# Patient Record
Sex: Male | Born: 1955 | Race: White | Hispanic: No | Marital: Married | Smoking: Never smoker
Health system: Southern US, Community
[De-identification: ages and names within clinical notes are randomized; demographics above are authoritative.]

## PROBLEM LIST (undated history)

## (undated) DIAGNOSIS — K859 Acute pancreatitis without necrosis or infection, unspecified: Secondary | ICD-10-CM

## (undated) DIAGNOSIS — K219 Gastro-esophageal reflux disease without esophagitis: Secondary | ICD-10-CM

## (undated) DIAGNOSIS — M1990 Unspecified osteoarthritis, unspecified site: Secondary | ICD-10-CM

## (undated) DIAGNOSIS — J45909 Unspecified asthma, uncomplicated: Secondary | ICD-10-CM

## (undated) DIAGNOSIS — E785 Hyperlipidemia, unspecified: Secondary | ICD-10-CM

## (undated) DIAGNOSIS — F419 Anxiety disorder, unspecified: Secondary | ICD-10-CM

## (undated) DIAGNOSIS — T7840XA Allergy, unspecified, initial encounter: Secondary | ICD-10-CM

## (undated) HISTORY — PX: TONSILLECTOMY: SUR1361

## (undated) HISTORY — PX: JOINT REPLACEMENT: SHX530

## (undated) HISTORY — DX: Acute pancreatitis without necrosis or infection, unspecified: K85.90

## (undated) HISTORY — DX: Hyperlipidemia, unspecified: E78.5

## (undated) HISTORY — DX: Allergy, unspecified, initial encounter: T78.40XA

## (undated) HISTORY — DX: Gastro-esophageal reflux disease without esophagitis: K21.9

## (undated) HISTORY — DX: Unspecified asthma, uncomplicated: J45.909

## (undated) HISTORY — DX: Anxiety disorder, unspecified: F41.9

---

## 2007-09-09 ENCOUNTER — Ambulatory Visit (HOSPITAL_COMMUNITY): Admission: RE | Admit: 2007-09-09 | Discharge: 2007-09-09 | Payer: Self-pay | Admitting: Urology

## 2014-04-18 DIAGNOSIS — K219 Gastro-esophageal reflux disease without esophagitis: Secondary | ICD-10-CM | POA: Insufficient documentation

## 2014-04-18 DIAGNOSIS — E782 Mixed hyperlipidemia: Secondary | ICD-10-CM | POA: Insufficient documentation

## 2014-11-21 DIAGNOSIS — L409 Psoriasis, unspecified: Secondary | ICD-10-CM | POA: Insufficient documentation

## 2014-11-21 DIAGNOSIS — R0683 Snoring: Secondary | ICD-10-CM | POA: Insufficient documentation

## 2014-11-21 DIAGNOSIS — N529 Male erectile dysfunction, unspecified: Secondary | ICD-10-CM | POA: Insufficient documentation

## 2014-11-21 DIAGNOSIS — J3089 Other allergic rhinitis: Secondary | ICD-10-CM | POA: Insufficient documentation

## 2014-12-14 DIAGNOSIS — Z79899 Other long term (current) drug therapy: Secondary | ICD-10-CM | POA: Insufficient documentation

## 2015-06-27 DIAGNOSIS — Z23 Encounter for immunization: Secondary | ICD-10-CM | POA: Insufficient documentation

## 2015-06-27 DIAGNOSIS — Z1211 Encounter for screening for malignant neoplasm of colon: Secondary | ICD-10-CM | POA: Insufficient documentation

## 2015-08-03 DIAGNOSIS — R198 Other specified symptoms and signs involving the digestive system and abdomen: Secondary | ICD-10-CM | POA: Insufficient documentation

## 2016-03-04 DIAGNOSIS — M62838 Other muscle spasm: Secondary | ICD-10-CM | POA: Insufficient documentation

## 2016-03-04 DIAGNOSIS — H5711 Ocular pain, right eye: Secondary | ICD-10-CM | POA: Insufficient documentation

## 2016-05-22 ENCOUNTER — Other Ambulatory Visit: Payer: Self-pay | Admitting: Family Medicine

## 2016-05-22 DIAGNOSIS — J320 Chronic maxillary sinusitis: Secondary | ICD-10-CM

## 2016-05-28 ENCOUNTER — Ambulatory Visit
Admission: RE | Admit: 2016-05-28 | Discharge: 2016-05-28 | Disposition: A | Discharge status: Home / Self Care | Source: Ambulatory Visit | Attending: Family Medicine | Admitting: Family Medicine

## 2016-05-28 DIAGNOSIS — J320 Chronic maxillary sinusitis: Secondary | ICD-10-CM

## 2016-06-27 DIAGNOSIS — R519 Headache, unspecified: Secondary | ICD-10-CM | POA: Insufficient documentation

## 2016-07-25 ENCOUNTER — Other Ambulatory Visit: Payer: Self-pay | Admitting: Family Medicine

## 2016-07-25 DIAGNOSIS — R519 Headache, unspecified: Secondary | ICD-10-CM

## 2016-07-25 DIAGNOSIS — R51 Headache: Secondary | ICD-10-CM

## 2016-07-25 DIAGNOSIS — H5711 Ocular pain, right eye: Secondary | ICD-10-CM

## 2016-07-26 ENCOUNTER — Ambulatory Visit
Admission: RE | Admit: 2016-07-26 | Discharge: 2016-07-26 | Disposition: A | Discharge status: Home / Self Care | Source: Ambulatory Visit | Attending: Family Medicine | Admitting: Family Medicine

## 2016-07-26 DIAGNOSIS — H5711 Ocular pain, right eye: Secondary | ICD-10-CM

## 2016-07-26 DIAGNOSIS — R519 Headache, unspecified: Secondary | ICD-10-CM

## 2016-07-26 DIAGNOSIS — R51 Headache: Secondary | ICD-10-CM

## 2017-04-18 DIAGNOSIS — B199 Unspecified viral hepatitis without hepatic coma: Secondary | ICD-10-CM | POA: Insufficient documentation

## 2017-04-18 DIAGNOSIS — F410 Panic disorder [episodic paroxysmal anxiety] without agoraphobia: Secondary | ICD-10-CM | POA: Insufficient documentation

## 2018-03-28 IMAGING — CT CT HEAD W/O CM
3 series · 16 of 47 positions shown, 19 images · non-contrast
Comparison: Maxillofacial CT 05/28/2016.

CLINICAL DATA: 60-year-old male with pain posterior the right eye
and headaches for the past 6 months.

EXAM:
CT HEAD WITHOUT CONTRAST
TECHNIQUE: Contiguous axial images were obtained from the base of the skull
through the vertex without intravenous contrast.

[Series 2: head w/(date) · axial · 0.46mm/px · z∈[+952,+1082]mm · 10 of 32 slices shown, 13 images]
[im 3/32  brain]
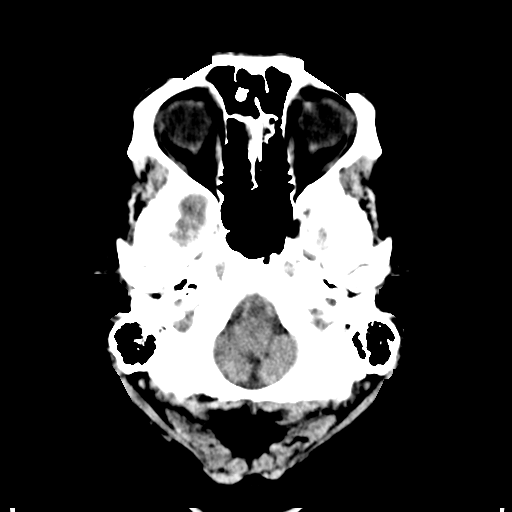
[im 3/32  bone]
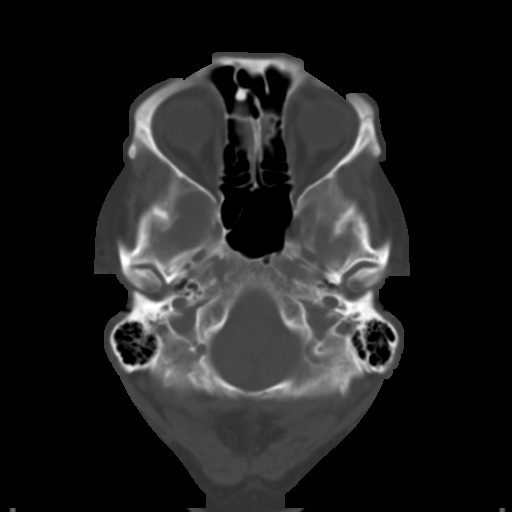
[im 6/32  brain]
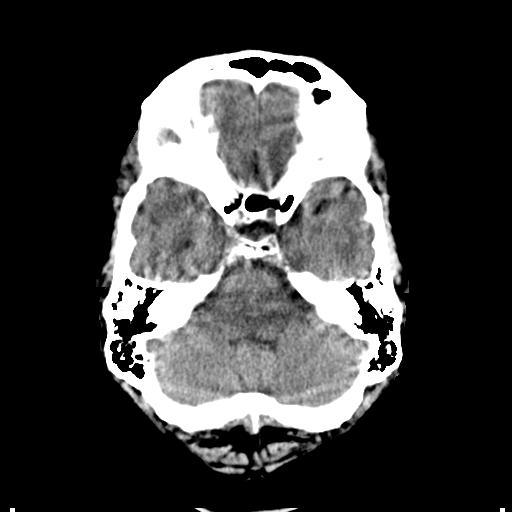
[im 9/32  brain]
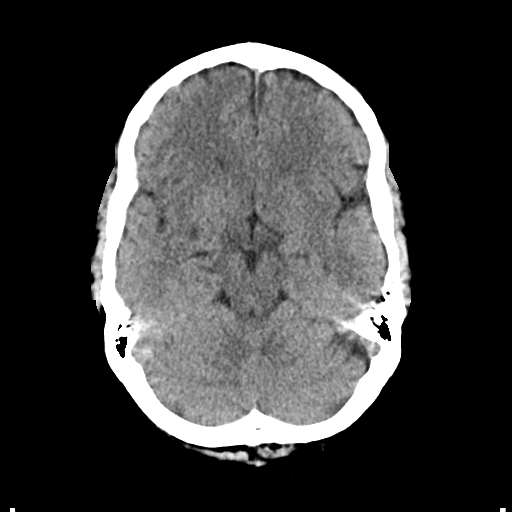
[im 11/32  brain]
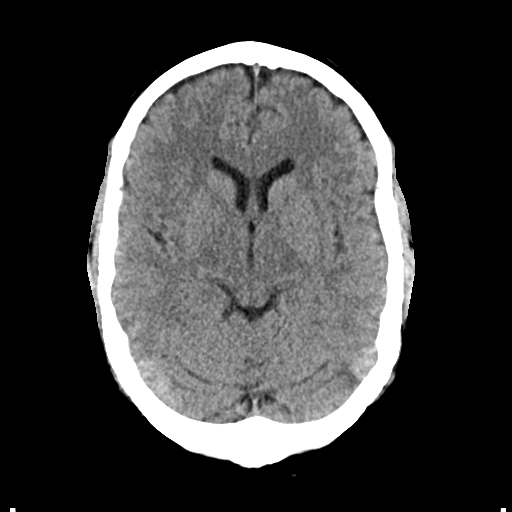
[im 14/32  brain]
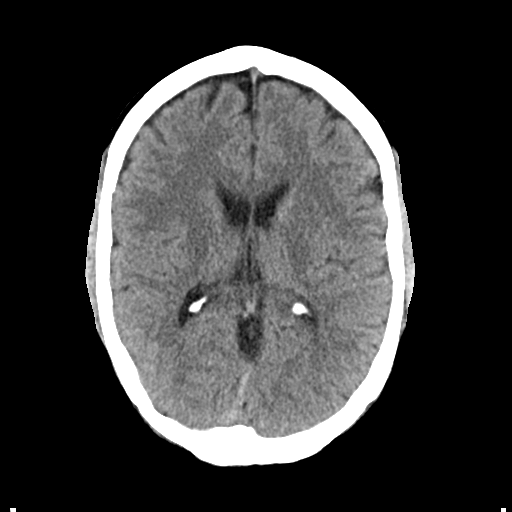
[im 14/32  bone]
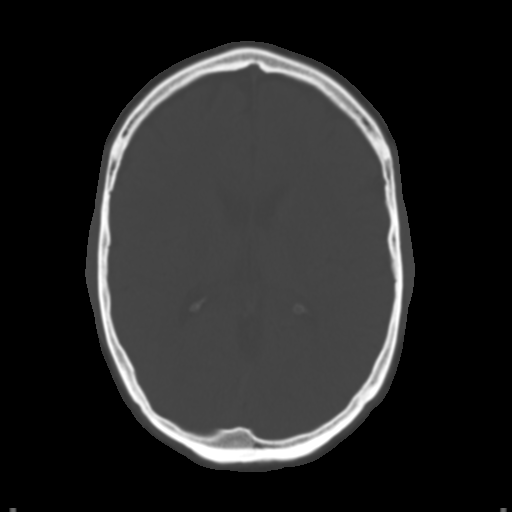
[im 18/32  brain]
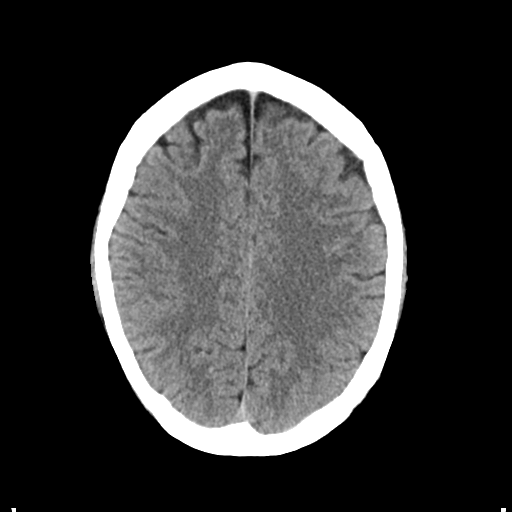
[im 21/32  brain]
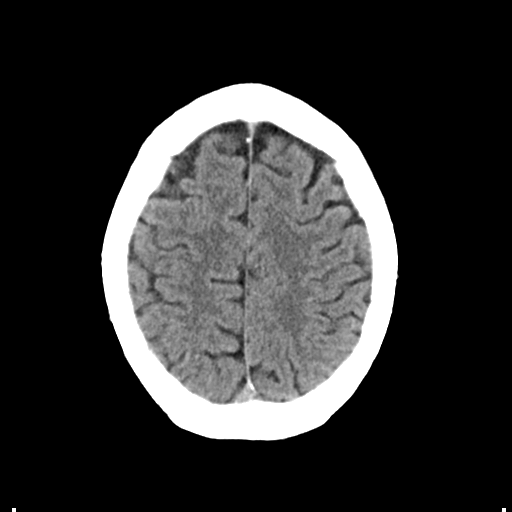
[im 24/32  brain]
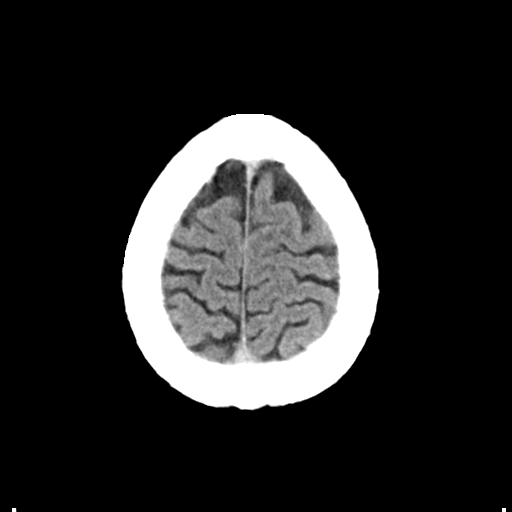
[im 26/32  brain]
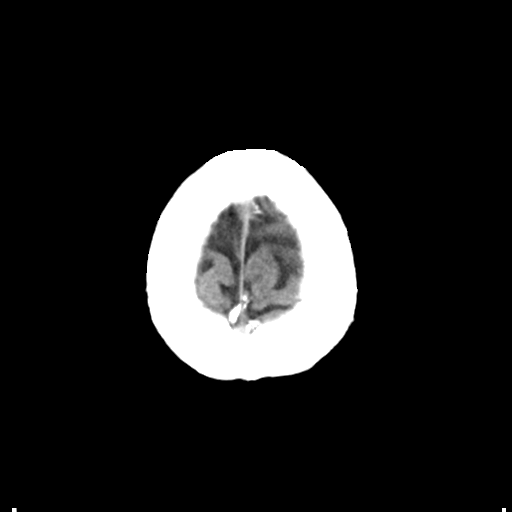
[im 26/32  bone]
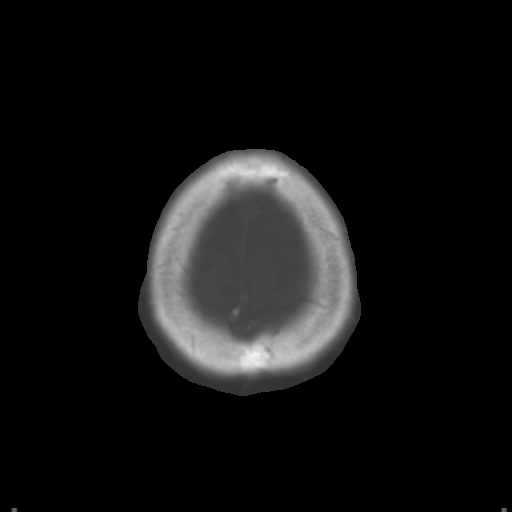
[im 29/32  brain]
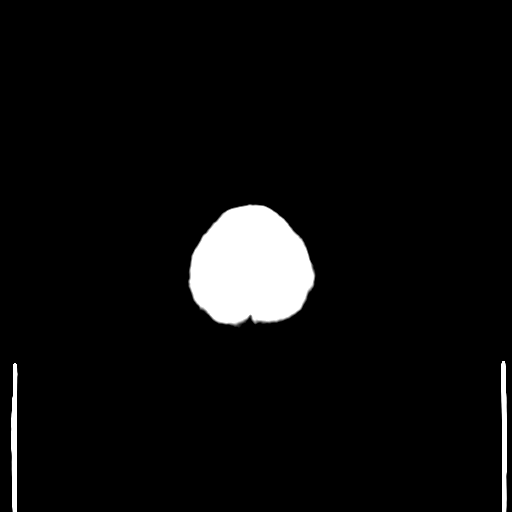

[Series 4: cor · coronal · 0.30mm/px · 3 of 68 slices shown]
[im 23/68  brain]
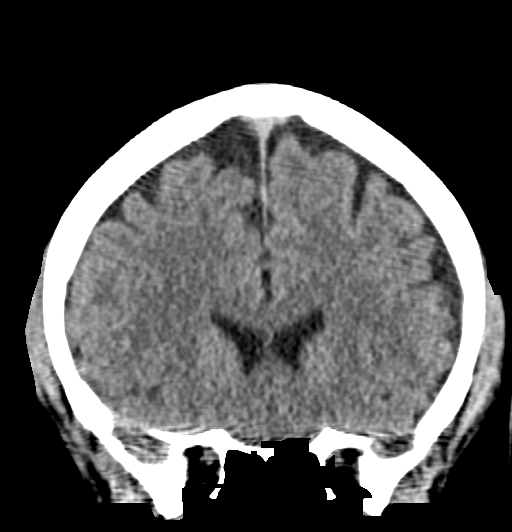
[im 30/68  brain]
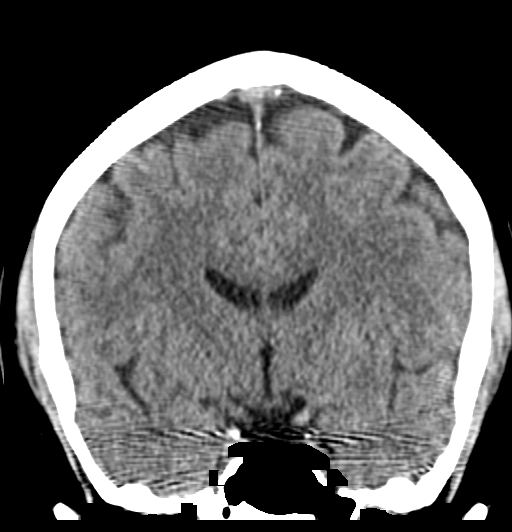
[im 38/68  brain]
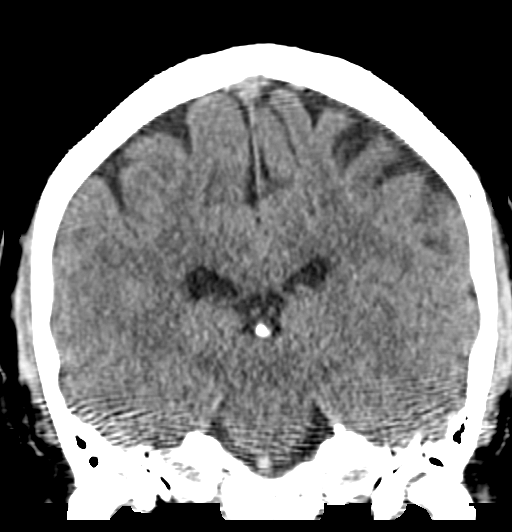

[Series 5: sag · sagittal · 0.31mm/px · 3 of 53 slices shown]
[im 18/53  brain]
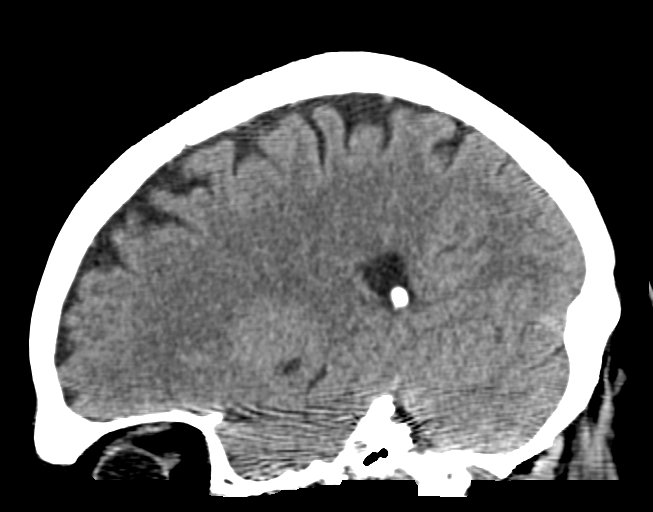
[im 27/53  brain]
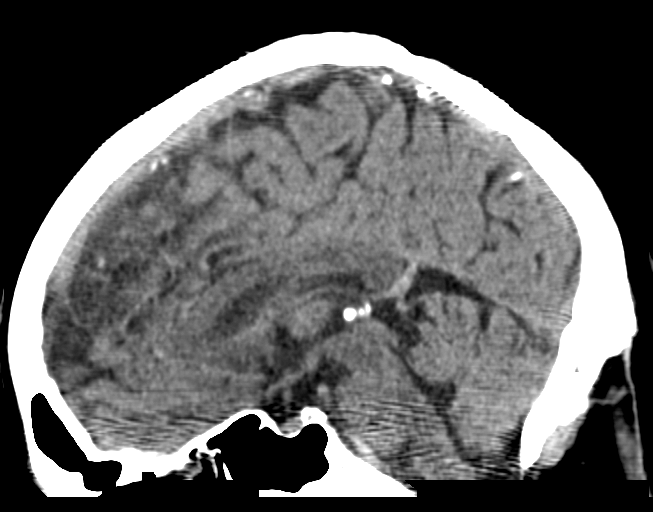
[im 35/53  brain]
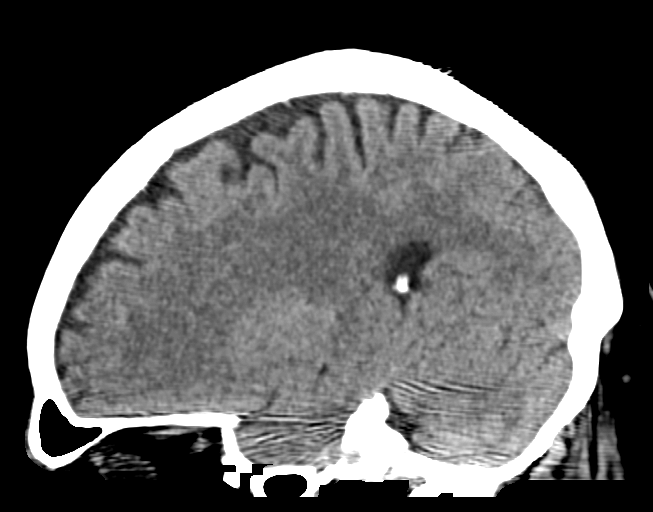

[16 of 47 positions shown; findings below may reference images not displayed]

FINDINGS: Brain: No evidence of acute infarction, hemorrhage, hydrocephalus,
extra-axial collection or mass lesion/mass effect.

Vascular: No hyperdense vessel or unexpected calcification.

Skull: Normal. Negative for fracture or focal lesion.

Sinuses/Orbits: No acute finding.

Other: None.
IMPRESSION: 1. No acute intracranial abnormalities.
2. The appearance of the brain is normal.

## 2018-03-31 DIAGNOSIS — E669 Obesity, unspecified: Secondary | ICD-10-CM | POA: Insufficient documentation

## 2018-03-31 DIAGNOSIS — E66811 Obesity, class 1: Secondary | ICD-10-CM | POA: Insufficient documentation

## 2018-04-01 ENCOUNTER — Encounter: Payer: Self-pay | Admitting: Psychology

## 2018-09-15 ENCOUNTER — Encounter: Admitting: Psychology

## 2018-09-22 ENCOUNTER — Encounter

## 2018-09-24 ENCOUNTER — Encounter: Admitting: Psychology

## 2019-12-02 ENCOUNTER — Encounter: Payer: Self-pay | Admitting: Gastroenterology

## 2020-01-10 ENCOUNTER — Other Ambulatory Visit: Payer: Self-pay

## 2020-01-10 DIAGNOSIS — K859 Acute pancreatitis without necrosis or infection, unspecified: Secondary | ICD-10-CM | POA: Insufficient documentation

## 2020-01-20 ENCOUNTER — Encounter: Payer: Self-pay | Admitting: Gastroenterology

## 2020-01-20 ENCOUNTER — Ambulatory Visit (INDEPENDENT_AMBULATORY_CARE_PROVIDER_SITE_OTHER): Admitting: Gastroenterology

## 2020-01-20 VITALS — BP 130/90 | HR 64 | Ht 71.0 in | Wt 256.0 lb

## 2020-01-20 DIAGNOSIS — K449 Diaphragmatic hernia without obstruction or gangrene: Secondary | ICD-10-CM

## 2020-01-20 DIAGNOSIS — K219 Gastro-esophageal reflux disease without esophagitis: Secondary | ICD-10-CM | POA: Diagnosis not present

## 2020-01-20 NOTE — Patient Instructions (Signed)
If you are age 65 or older, your body mass index should be between 23-30. Your Body mass index is 35.7 kg/m. If this is out of the aforementioned range listed, please consider follow up with your Primary Care Provider.  If you are age 44 or younger, your body mass index should be between 19-25. Your Body mass index is 35.7 kg/m. If this is out of the aformentioned range listed, please consider follow up with your Primary Care Provider.   You will be due for a recall colonoscopy/EGD in March 2027. We will send you a reminder in the mail when it gets closer to that time.  It was a pleasure to see you today!  Dr. Myrtie Neither

## 2020-01-20 NOTE — Progress Notes (Signed)
[facility] Consult Note:  History: William Lynch 01/20/2020  Referring provider: Angelica Chessman, MD  Reason for consult/chief complaint: Gastroesophageal Reflux (Chronic, medically controlled, family hx of esophageal issues, discuss EGD)   Subjective  HPI:  This is a very pleasant 64 year old man accompanied by his wife, requesting opinion regarding management of his GERD and any necessary endoscopic follow-up.  He has had perhaps 15 years of heartburn and regurgitation, generally under very good control with once daily pantoprazole (other PPIs in the past).  On occasion years ago he might miss a dose and noticed heartburn returning, so he has really stayed on it every day since then.  He denies dysphagia, odynophagia, nausea vomiting early satiety or weight loss.  Rare nocturnal symptoms, perhaps just a couple times a year, if he eats late.  Bowel habits are regular without rectal bleeding.  Previous endoscopic testing as noted below.   ROS:  Review of Systems He denies chest pain dyspnea or dysuria  Past Medical History: Past Medical History:  Diagnosis Date  . Asthma    childhood  . GERD (gastroesophageal reflux disease)   . Hyperlipemia   . Pancreatitis      Past Surgical History:  No surgeries   Family History: Family History  Problem Relation Age of Onset  . Pneumonia Father        with aspiration   . Hypertension Father   . Aneurysm Father   . Cancer Father   . Cancer Mother   . Bipolar disorder Son     Part of the reason for visit today was that William Lynch's father recently passed, and apparently of complications from aspiration.  He understood this may have been a chronic problem for his father but he was unaware of it.  Also multiple extended family members have apparently been diagnosed with various upper digestive conditions.  Social History: Social History   Socioeconomic History  . Marital status: Married    Spouse name: Not  on file  . Number of children: 2  . Years of education: Not on file  . Highest education level: Not on file  Occupational History  . Occupation: IT  Tobacco Use  . Smoking status: Never Smoker  . Smokeless tobacco: Never Used  Substance and Sexual Activity  . Alcohol use: Not Currently  . Drug use: Never  . Sexual activity: Yes  Other Topics Concern  . Not on file  Social History Narrative  . Not on file   Social Determinants of Health   Financial Resource Strain:   . Difficulty of Paying Living Expenses:   Food Insecurity:   . Worried About Programme researcher, broadcasting/film/video in the Last Year:   . Barista in the Last Year:   Transportation Needs:   . Freight forwarder (Medical):   Marland Kitchen Lack of Transportation (Non-Medical):   Physical Activity:   . Days of Exercise per Week:   . Minutes of Exercise per Session:   Stress:   . Feeling of Stress :   Social Connections:   . Frequency of Communication with Friends and Family:   . Frequency of Social Gatherings with Friends and Family:   . Attends Religious Services:   . Active Member of Clubs or Organizations:   . Attends Banker Meetings:   Marland Kitchen Marital Status:     Allergies: Allergies  Allergen Reactions  . Sulfa Antibiotics Other (See Comments)    Swelling of eyes, with eye drainage  .  Sulfamethoxazole Other (See Comments)    Unsure had medicine as a child Unsure had medicine as a child   . Other Nausea And Vomiting    States topical iodine causes no problems. Other reaction(s): Nausea And Vomiting States topical iodine causes no problems.     Outpatient Meds: Current Outpatient Medications  Medication Sig Dispense Refill  . clobetasol cream (TEMOVATE) 0.05 % Apply 1 application topically 2 (two) times daily.     Marland Kitchen ibuprofen (ADVIL) 200 MG tablet Take by mouth.    . levocetirizine (XYZAL) 5 MG tablet Take by mouth.    . pantoprazole (PROTONIX) 40 MG tablet Take by mouth.    . simvastatin (ZOCOR) 10  MG tablet Take 1 tablet by mouth at bedtime.    . tadalafil (CIALIS) 20 MG tablet      No current facility-administered medications for this visit.      ___________________________________________________________________ Objective   Exam:  BP (!) 130/90   Pulse 64   Ht 5\' 11"  (1.803 m)   Wt (!) 256 lb (116.1 kg)   BMI 35.70 kg/m    General: Well-appearing, pleasant and conversational, normal vocal quality  Eyes: sclera anicteric, no redness  ENT: oral mucosa moist without lesions, no cervical or supraclavicular lymphadenopathy  CV: RRR without murmur, S1/S2, no JVD, no peripheral edema  Resp: clear to auscultation bilaterally, normal RR and effort noted  GI: soft, obese, no tenderness, with active bowel sounds. No guarding or palpable organomegaly noted, limited by body habitus  Skin; warm and dry, no rash or jaundice noted  Neuro: awake, alert and oriented x 3. Normal gross motor function and fluent speech   Other:  Report of upper endoscopy and colonoscopy by Dr. March 2017:  Minimal gastritis without erosion, biopsy taken to rule out H. Pylori. Tiny hyperplastic probable fundic gland polyp, one of them was biopsied. Small sliding hiatal hernia.  Minimal distal esophagitis with no stricture or tumor.  Biopsy was taken proximal to the Z-line.  Oropharynx and larynx appeared normal.  Colonoscopy complete to cecum with good preparation.  No polyps seen.  Minimal internal hemorrhoids without inflammation.  Biopsies negative for H. pylori, benign fundic gland polyp, benign distal esophageal mucosa without inflammation, no Barrett's.  Assessment: Encounter Diagnoses  Name Primary?  . Gastroesophageal reflux disease without esophagitis Yes  . Hiatal hernia     Many years of chronic stable GERD symptoms, rare breakthrough so generally good control with medicines, diet and lifestyle.  Some weight loss would help his symptoms, something of which he is  aware.  Hiatal hernia was described as small and sliding, so questionable contribution to the reflux.  Plan:  I reassured him that he does not eat any endoscopic evaluation at this point since he has been stable for so long and continued to do so well since the 2017 endoscopy.  However, there is the risk of Barrett's esophagus, so I recommended an upper endoscopy with his next scheduled routine colonoscopy in 2027.  He plans to see our practice for that, so we put recalls  for both exams in our database.  I recommended he try a decrease of the PPI to once every other day and see if he still has good symptom control.  Thank you for the courtesy of this consult.  Please call me with any questions or concerns.  2028 III  CC: Referring provider noted above

## 2020-04-08 ENCOUNTER — Ambulatory Visit: Attending: Internal Medicine

## 2020-04-08 DIAGNOSIS — Z23 Encounter for immunization: Secondary | ICD-10-CM

## 2020-04-08 NOTE — Progress Notes (Signed)
   Covid-19 Vaccination Clinic  Name:  William Lynch    MRN: [id number] DOB: [birth year]  04/08/2020  [name] was observed post Covid-19 immunization for 15 minutes without incident. He was provided with Vaccine Information Sheet and instruction to access the V-Safe system.   [name] was instructed to call 911 with any severe reactions post vaccine: Marland Kitchen Difficulty breathing  . Swelling of face and throat  . A fast heartbeat  . A bad rash all over body  . Dizziness and weakness

## 2020-06-01 ENCOUNTER — Other Ambulatory Visit: Payer: Self-pay

## 2020-06-01 ENCOUNTER — Ambulatory Visit (INDEPENDENT_AMBULATORY_CARE_PROVIDER_SITE_OTHER): Payer: Medicare Other | Admitting: Otolaryngology

## 2020-06-01 ENCOUNTER — Encounter (INDEPENDENT_AMBULATORY_CARE_PROVIDER_SITE_OTHER): Payer: Self-pay | Admitting: Otolaryngology

## 2020-06-01 VITALS — Temp 96.3°F

## 2020-06-01 DIAGNOSIS — H9313 Tinnitus, bilateral: Secondary | ICD-10-CM

## 2020-06-01 DIAGNOSIS — H6123 Impacted cerumen, bilateral: Secondary | ICD-10-CM

## 2020-06-01 DIAGNOSIS — R0683 Snoring: Secondary | ICD-10-CM

## 2020-06-01 NOTE — Progress Notes (Signed)
HPI: William Lynch is a 64 y.o. male who presents is referred by by his PCP for evaluation of tinnitus in both ears except for a number of years.  He also notices blockage in the left ear when he sleeps on the left side when he wakes up in the morning.  He denies history of loud noise exposure. He also inquires about any options for treatment of snoring.  He has had a long history of snoring problems and previously had palatoplasty by a Lexicographer years ago.  He states that he feels like he sleeps well does not wake up during the night.  Denies being tired during the day or taking naps and does not have clinical symptoms of obstructive sleep apnea.  Denies any trouble breathing through his nose.  Past Medical History:  Diagnosis Date  . Asthma    childhood  . GERD (gastroesophageal reflux disease)   . Hyperlipemia   . Pancreatitis    Past Surgical History:  Procedure Laterality Date  . TONSILLECTOMY     Social History   Socioeconomic History  . Marital status: Married    Spouse name: Not on file  . Number of children: 2  . Years of education: Not on file  . Highest education level: Not on file  Occupational History  . Occupation: IT  Tobacco Use  . Smoking status: Never Smoker  . Smokeless tobacco: Never Used  Substance and Sexual Activity  . Alcohol use: Not Currently  . Drug use: Never  . Sexual activity: Yes  Other Topics Concern  . Not on file  Social History Narrative  . Not on file   Social Determinants of Health   Financial Resource Strain: Not on file  Food Insecurity: Not on file  Transportation Needs: Not on file  Physical Activity: Not on file  Stress: Not on file  Social Connections: Not on file   Family History  Problem Relation Age of Onset  . Pneumonia Father        with aspiration   . Hypertension Father   . Aneurysm Father   . Cancer Father   . Cancer Mother   . Bipolar disorder Son    Allergies  Allergen Reactions  . Sulfa Antibiotics  Other (See Comments)    Swelling of eyes, with eye drainage  . Sulfamethoxazole Other (See Comments)    Unsure had medicine as a child Unsure had medicine as a child   . Other Nausea And Vomiting    States topical iodine causes no problems. Other reaction(s): Nausea And Vomiting States topical iodine causes no problems.    Prior to Admission medications   Medication Sig Start Date End Date Taking? Authorizing Provider  clobetasol cream (TEMOVATE) 0.05 % Apply 1 application topically 2 (two) times daily.    Yes [provider]  ibuprofen (ADVIL) 200 MG tablet Take by mouth.   Yes [provider]  levocetirizine (XYZAL) 5 MG tablet Take by mouth.   Yes [provider]  simvastatin (ZOCOR) 10 MG tablet Take 1 tablet by mouth at bedtime. 06/27/16  Yes [provider]  tadalafil (CIALIS) 20 MG tablet  06/27/16  Yes [provider]  pantoprazole (PROTONIX) 40 MG tablet Take by mouth. 06/27/16 02/03/20  [provider]     Positive ROS: Otherwise negative  All other systems have been reviewed and were otherwise negative with the exception of those mentioned in the HPI and as above.  Physical Exam: Constitutional: Alert, well-appearing,  no acute distress Ears: External ears without lesions or tenderness. Ear canals reveal a large amount of wax in both ear canals worse on the left side.  This was cleaned in the office using suction and curettes.  TMs were clear otherwise. Nasal: External nose without lesions. Septum minimal deviation to the right with mild rhinitis.. Clear nasal passages otherwise. Oral: Lips and gums without lesions. Tongue and palate mucosa without lesions. Posterior oropharynx clear.  Patient is status post tonsillectomy and palatoplasty with no uvula Neck: No palpable adenopathy or masses Respiratory: Breathing comfortably  Skin: No facial/neck lesions or rash noted.  Cerumen impaction removal  Date/Time: 06/01/2020 1:08  PM Performed by: Drema Halon, MD Authorized by: Drema Halon, MD   Consent:    Consent obtained:  Verbal   Consent given by:  Patient   Risks discussed:  Pain and bleeding Procedure details:    Location:  L ear and R ear   Procedure type: curette and suction   Post-procedure details:    Inspection:  TM intact and canal normal   Hearing quality:  Improved   Patient tolerance of procedure:  Tolerated well, no immediate complications Comments:     TMs are clear bilaterally.    Assessment: Tinnitus Cerumen buildup in both ears worse on the left Snoring status post previous palatoplasty  Plan: Would recommend use of Nasacort or Flonase 2 sprays each nostril at night for any nasal congestion that may help with her snoring.  Briefly discussed with him concerning use of Afrin occasionally to see if this helps significantly with his snoring.  I would not recommend using this regularly. Unable to obtain audiogram today and will follow up tomorrow after audiologic testing.   Narda Bonds, MD   CC:

## 2020-06-02 ENCOUNTER — Encounter (INDEPENDENT_AMBULATORY_CARE_PROVIDER_SITE_OTHER): Payer: Self-pay | Admitting: Otolaryngology

## 2020-06-02 ENCOUNTER — Other Ambulatory Visit: Payer: Self-pay

## 2020-06-02 ENCOUNTER — Ambulatory Visit (INDEPENDENT_AMBULATORY_CARE_PROVIDER_SITE_OTHER): Payer: Medicare Other | Admitting: Otolaryngology

## 2020-06-02 VITALS — Temp 97.0°F

## 2020-06-02 DIAGNOSIS — H9313 Tinnitus, bilateral: Secondary | ICD-10-CM

## 2020-06-02 NOTE — Progress Notes (Signed)
HPI: William Lynch is a 64 y.o. male who returns today for evaluation of audiogram.  He was seen yesterday but was unable to get audiogram.  Audiogram was performed in the office today because of chronic tinnitus in his ears.  On review of the audiogram this demonstrated essentially normal hearing up to 4000 frequency in both ears but then in the left ear he had a downsloping sensorineural hearing loss of moderate degree.  The right ear had no significant drop in the hearing loss.  He had type A tympanograms bilaterally.  He apparently has had history of ringing in his ears for years..  Past Medical History:  Diagnosis Date  . Asthma    childhood  . GERD (gastroesophageal reflux disease)   . Hyperlipemia   . Pancreatitis    Past Surgical History:  Procedure Laterality Date  . TONSILLECTOMY     Social History   Socioeconomic History  . Marital status: Married    Spouse name: Not on file  . Number of children: 2  . Years of education: Not on file  . Highest education level: Not on file  Occupational History  . Occupation: IT  Tobacco Use  . Smoking status: Never Smoker  . Smokeless tobacco: Never Used  Substance and Sexual Activity  . Alcohol use: Not Currently  . Drug use: Never  . Sexual activity: Yes  Other Topics Concern  . Not on file  Social History Narrative  . Not on file   Social Determinants of Health   Financial Resource Strain: Not on file  Food Insecurity: Not on file  Transportation Needs: Not on file  Physical Activity: Not on file  Stress: Not on file  Social Connections: Not on file   Family History  Problem Relation Age of Onset  . Pneumonia Father        with aspiration   . Hypertension Father   . Aneurysm Father   . Cancer Father   . Cancer Mother   . Bipolar disorder Son    Allergies  Allergen Reactions  . Sulfa Antibiotics Other (See Comments)    Swelling of eyes, with eye drainage  . Sulfamethoxazole Other (See Comments)    Unsure had  medicine as a child Unsure had medicine as a child   . Other Nausea And Vomiting    States topical iodine causes no problems. Other reaction(s): Nausea And Vomiting States topical iodine causes no problems.    Prior to Admission medications   Medication Sig Start Date End Date Taking? Authorizing Provider  clobetasol cream (TEMOVATE) 0.05 % Apply 1 application topically 2 (two) times daily.     [provider]  ibuprofen (ADVIL) 200 MG tablet Take by mouth.    [provider]  levocetirizine (XYZAL) 5 MG tablet Take by mouth.    [provider]  pantoprazole (PROTONIX) 40 MG tablet Take by mouth. 06/27/16 02/03/20  [provider]  simvastatin (ZOCOR) 10 MG tablet Take 1 tablet by mouth at bedtime. 06/27/16   [provider]  tadalafil (CIALIS) 20 MG tablet  06/27/16   [provider]     Positive ROS: Otherwise negative  All other systems have been reviewed and were otherwise negative with the exception of those mentioned in the HPI and as above.  Physical Exam: Constitutional: Alert, well-appearing, no acute distress Ears: External ears without lesions or tenderness. Ear canals are clear bilaterally with intact, clear TMs.  Bilaterally. Nasal: External nose without lesions.  Moderate  rhinitis bilaterally. Clear nasal passages otherwise. Oral: Lips and gums without lesions. Tongue and palate mucosa without lesions. Posterior oropharynx clear. Neck: No palpable adenopathy or masses Respiratory: Breathing comfortably  Skin: No facial/neck lesions or rash noted.  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  Suggested using masking noise to help control  tinnitus when it is quiet or when the tinnitus is worse. Also reviewed with him concerning using ear protection when around loud noise. Gave him some  samples of Lipo flavonoid to try as this is beneficial in some people with tinnitus. He will follow-up as needed.   Narda Bonds, MD

## 2020-07-04 ENCOUNTER — Encounter (INDEPENDENT_AMBULATORY_CARE_PROVIDER_SITE_OTHER): Payer: Self-pay

## 2021-11-07 DIAGNOSIS — L503 Dermatographic urticaria: Secondary | ICD-10-CM | POA: Insufficient documentation

## 2023-02-14 ENCOUNTER — Ambulatory Visit: Payer: Self-pay | Admitting: Student

## 2023-02-27 NOTE — Progress Notes (Addendum)
COVID Vaccine received:  []  No [x]  Yes Date of any COVID positive Test in last 90 days: No PCP - Bernadette Hoit MD Cardiologist - no  Medical Clearance -Dr. Riley Nearing 12/27/22 [facility]  Chest x-ray -  EKG -  July 5,2024 Stress Test -  ECHO -  Cardiac Cath -   Bowel Prep - [x]  No  []   Yes ______  Pacemaker / ICD device [x]  No []  Yes   Spinal Cord Stimulator:[x]  No []  Yes       History of Sleep Apnea? [x]  No []  Yes   CPAP used?- [x]  No []  Yes    Does the patient monitor blood sugar?          [x]  No []  Yes  []  N/A  Patient has: [x]  NO Hx DM   []  Pre-DM                 []  DM1  []   DM2 Does patient have a [employer] or Dexacom? []  No []  Yes   Fasting Blood Sugar Ranges-  Checks Blood Sugar _____ times a day  GLP1 agonist / usual dose - no GLP1 instructions:  SGLT-2 inhibitors / usual dose - no SGLT-2 instructions:   Blood Thinner / Instructions:no Aspirin Instructions:no  Comments:   Activity level: Patient is able to climb a flight of stairs without difficulty; [x]  No CP  [x]  No SOB, but would have _hip pain__   Patient canperform ADLs without assistance.   Anesthesia review:   Patient denies shortness of breath, fever, cough and chest pain at PAT appointment.  Patient verbalized understanding and agreement to the Pre-Surgical Instructions that were given to them at this PAT appointment. Patient was also educated of the need to review these PAT instructions again prior to his/her surgery.I reviewed the appropriate phone numbers to call if they have any and questions or concerns.

## 2023-02-27 NOTE — Patient Instructions (Addendum)
SURGICAL WAITING ROOM VISITATION  Patients having surgery or a procedure may have no more than 2 support people in the waiting area - these visitors may rotate.    Children under the age of 81 must have an adult with them who is not the patient.  Due to an increase in RSV and influenza rates and associated hospitalizations, children ages 69 and under may not visit patients in [facility] hospitals.  If the patient needs to stay at the hospital during part of their recovery, the visitor guidelines for inpatient rooms apply. Pre-op nurse will coordinate an appropriate time for 1 support person to accompany patient in pre-op.  This support person may not rotate.    Please refer to the [facility] website for the visitor guidelines for Inpatients (after your surgery is over and you are in a regular room).       Your procedure is scheduled on: 03/13/23   Report to [facility] Main Entrance    Report to admitting at 5:15 AM   Call this number if you have problems the morning of surgery [phone]   Do not eat food :After Midnight.   After Midnight you may have the following liquids until 4:30 AM DAY OF SURGERY  Water Non-Citrus Juices (without pulp, NO RED-Apple, White grape, White cranberry) Black Coffee (NO MILK/CREAM OR CREAMERS, sugar ok)  Clear Tea (NO MILK/CREAM OR CREAMERS, sugar ok) regular and decaf                             Plain Jell-O (NO RED)                                           Fruit ices (not with fruit pulp, NO RED)                                     Popsicles (NO RED)                                                               Sports drinks like Gatorade (NO RED)                    The day of surgery:  Drink ONE (1) Pre-Surgery Clear Ensure  At 4:30  AM the morning of surgery. Drink in one sitting. Do not sip.  This drink was given to you during your hospital  pre-op appointment visit. Nothing else to drink after completing the  Pre-Surgery  Clear Ensure    Oral Hygiene is also important to reduce your risk of infection.                                    Remember - BRUSH YOUR TEETH THE MORNING OF SURGERY WITH YOUR REGULAR TOOTHPASTE   Stop all vitamins and herbal supplements 7 days before surgery.   Take these medication day of surgery: Ezetimibe, Xyzal, Protonix  You may not have any metal on your body including hair pins, jewelry, and body piercing             Do not wear make-up, lotions, powders, perfumes/cologne, or deodorant              Men may shave face and neck.   Do not bring valuables to the hospital. [facility] IS NOT             RESPONSIBLE   FOR VALUABLES.   Contacts, glasses, dentures or bridgework may not be worn into surgery.  DO NOT BRING YOUR HOME MEDICATIONS TO THE HOSPITAL. PHARMACY WILL DISPENSE MEDICATIONS LISTED ON YOUR MEDICATION LIST TO YOU DURING YOUR ADMISSION IN THE HOSPITAL!    Patients discharged on the day of surgery will not be allowed to drive home.  Someone NEEDS to stay with you for the first 24 hours after anesthesia.   Special Instructions: Bring a copy of your healthcare power of attorney and living will documents the day of surgery if you haven't scanned them before.              Please read over the following fact sheets you were given: IF YOU HAVE QUESTIONS ABOUT YOUR PRE-OP INSTRUCTIONS PLEASE CALL [phone] Rosey [facility]   If you received a COVID test during your pre-op visit  it is requested that you wear a mask when out in public, stay away from anyone that may not be feeling well and notify your surgeon if you develop symptoms. If you test positive for Covid or have been in contact with anyone that has tested positive in the last 10 days please notify you surgeon.      Pre-operative 5 CHG Bath Instructions   You can play a key role in reducing the risk of infection after surgery. Your skin needs to be as free of germs as possible. You can reduce the number of  germs on your skin by washing with CHG (chlorhexidine gluconate) soap before surgery. CHG is an antiseptic soap that kills germs and continues to kill germs even after washing.   DO NOT use if you have an allergy to chlorhexidine/CHG or antibacterial soaps. If your skin becomes reddened or irritated, stop using the CHG and notify one of our RNs at [phone].   Please shower with the CHG soap starting 4 days before surgery using the following schedule:     Please keep in mind the following:  DO NOT shave, including legs and underarms, starting the day of your first shower.   You may shave your face at any point before/day of surgery.  Place clean sheets on your bed the day you start using CHG soap. Use a clean washcloth (not used since being washed) for each shower. DO NOT sleep with pets once you start using the CHG.   CHG Shower Instructions:  If you choose to wash your hair and private area, wash first with your normal shampoo/soap.  After you use shampoo/soap, rinse your hair and body thoroughly to remove shampoo/soap residue.  Turn the water OFF and apply about 3 tablespoons (45 ml) of CHG soap to a CLEAN washcloth.  Apply CHG soap ONLY FROM YOUR NECK DOWN TO YOUR TOES (washing for 3-5 minutes)  DO NOT use CHG soap on face, private areas, open wounds, or sores.  Pay special attention to the area where your surgery is being performed.  If you are having back surgery, having someone wash your back for you  may be helpful. Wait 2 minutes after CHG soap is applied, then you may rinse off the CHG soap.  Pat dry with a clean towel  Put on clean clothes/pajamas   If you choose to wear lotion, please use ONLY the CHG-compatible lotions on the back of this paper.     Additional instructions for the day of surgery: DO NOT APPLY any lotions, deodorants, cologne, or perfumes.   Put on clean/comfortable clothes.  Brush your teeth.  Ask your nurse before applying any prescription medications  to the skin.      CHG Compatible Lotions   Aveeno Moisturizing lotion  Cetaphil Moisturizing Cream  Cetaphil Moisturizing Lotion  Clairol Herbal Essence Moisturizing Lotion, Dry Skin  Clairol Herbal Essence Moisturizing Lotion, Extra Dry Skin  Clairol Herbal Essence Moisturizing Lotion, Normal Skin  Curel Age Defying Therapeutic Moisturizing Lotion with Alpha Hydroxy  Curel Extreme Care Body Lotion  Curel Soothing Hands Moisturizing Hand Lotion  Curel Therapeutic Moisturizing Cream, Fragrance-Free  Curel Therapeutic Moisturizing Lotion, Fragrance-Free  Curel Therapeutic Moisturizing Lotion, Original Formula  Eucerin Daily Replenishing Lotion  Eucerin Dry Skin Therapy Plus Alpha Hydroxy Crme  Eucerin Dry Skin Therapy Plus Alpha Hydroxy Lotion  Eucerin Original Crme  Eucerin Original Lotion  Eucerin Plus Crme Eucerin Plus Lotion  Eucerin TriLipid Replenishing Lotion  Keri Anti-Bacterial Hand Lotion  Keri Deep Conditioning Original Lotion Dry Skin Formula Softly Scented  Keri Deep Conditioning Original Lotion, Fragrance Free Sensitive Skin Formula  Keri Lotion Fast Absorbing Fragrance Free Sensitive Skin Formula  Keri Lotion Fast Absorbing Softly Scented Dry Skin Formula  Keri Original Lotion  Keri Skin Renewal Lotion Keri Silky Smooth Lotion  Keri Silky Smooth Sensitive Skin Lotion  Nivea Body Creamy Conditioning Oil  Nivea Body Extra Enriched Lotion  Nivea Body Original Lotion  Nivea Body Sheer Moisturizing Lotion Nivea Crme  Nivea Skin Firming Lotion  NutraDerm 30 Skin Lotion  NutraDerm Skin Lotion  NutraDerm Therapeutic Skin Cream  NutraDerm Therapeutic Skin Lotion  ProShield Protective Hand Cream  Provon moisturizing lotion Incentive Spirometer (Watch this video at home: [email or web address])  An incentive spirometer is a tool that can help keep your lungs clear and active. This tool measures how well you are filling your lungs with each  breath. Taking long deep breaths may help reverse or decrease the chance of developing breathing (pulmonary) problems (especially infection) following: A long period of time when you are unable to move or be active. BEFORE THE PROCEDURE  If the spirometer includes an indicator to show your best effort, your nurse or respiratory therapist will set it to a desired goal. If possible, sit up straight or lean slightly forward. Try not to slouch. Hold the incentive spirometer in an upright position. INSTRUCTIONS FOR USE  Sit on the edge of your bed if possible, or sit up as far as you can in bed or on a chair. Hold the incentive spirometer in an upright position. Breathe out normally. Place the mouthpiece in your mouth and seal your lips tightly around it. Breathe in slowly and as deeply as possible, raising the piston or the ball toward the top of the column. Hold your breath for 3-5 seconds or for as long as possible. Allow the piston or ball to fall to the bottom of the column. Remove the mouthpiece from your mouth and breathe out normally. Rest for a few seconds and repeat Steps 1 through 7 at least 10 times every 1-2 hours when you are awake. Take your time  and take a few normal breaths between deep breaths. The spirometer may include an indicator to show your best effort. Use the indicator as a goal to work toward during each repetition. After each set of 10 deep breaths, practice coughing to be sure your lungs are clear. If you have an incision (the cut made at the time of surgery), support your incision when coughing by placing a pillow or rolled up towels firmly against it. Once you are able to get out of bed, walk around indoors and cough well. You may stop using the incentive spirometer when instructed by your caregiver.  RISKS AND COMPLICATIONS Take your time so you do not get dizzy or light-headed. If you are in pain, you may need to take or ask for pain medication before doing incentive  spirometry. It is harder to take a deep breath if you are having pain. AFTER USE Rest and breathe slowly and easily. It can be helpful to keep track of a log of your progress. Your caregiver can provide you with a simple table to help with this. If you are using the spirometer at home, follow these instructions: SEEK MEDICAL CARE IF:  You are having difficultly using the spirometer. You have trouble using the spirometer as often as instructed. Your pain medication is not giving enough relief while using the spirometer. You develop fever of 100.5 F (38.1 C) or higher. SEEK IMMEDIATE MEDICAL CARE IF:  You cough up bloody sputum that had not been present before. You develop fever of 102 F (38.9 C) or greater. You develop worsening pain at or near the incision site. MAKE SURE YOU:  Understand these instructions. Will watch your condition. Will get help right away if you are not doing well or get worse. Document Released: 10/21/2006 Document Revised: 09/02/2011 Document Reviewed: 12/22/2006 [facility] Patient Information 2014 [place], [place]. WHAT IS A BLOOD TRANSFUSION? Blood Transfusion Information  A transfusion is the replacement of blood or some of its parts. Blood is made up of multiple cells which provide different functions. Red blood cells carry oxygen and are used for blood loss replacement. White blood cells fight against infection. Platelets control bleeding. Plasma helps clot blood. Other blood products are available for specialized needs, such as hemophilia or other clotting disorders. BEFORE THE TRANSFUSION  Who gives blood for transfusions?  Healthy volunteers who are fully evaluated to make sure their blood is safe. This is blood bank blood. Transfusion therapy is the safest it has ever been in the practice of medicine. Before blood is taken from a donor, a complete history is taken to make sure that person has no history of diseases nor engages in risky social behavior  (examples are intravenous drug use or sexual activity with multiple partners). The donor's travel history is screened to minimize risk of transmitting infections, such as malaria. The donated blood is tested for signs of infectious diseases, such as HIV and hepatitis. The blood is then tested to be sure it is compatible with you in order to minimize the chance of a transfusion reaction. If you or a relative donates blood, this is often done in anticipation of surgery and is not appropriate for emergency situations. It takes many days to process the donated blood. RISKS AND COMPLICATIONS Although transfusion therapy is very safe and saves many lives, the main dangers of transfusion include:  Getting an infectious disease. Developing a transfusion reaction. This is an allergic reaction to something in the blood you were given. Every precaution is  taken to prevent this. The decision to have a blood transfusion has been considered carefully by your caregiver before blood is given. Blood is not given unless the benefits outweigh the risks. AFTER THE TRANSFUSION Right after receiving a blood transfusion, you will usually feel much better and more energetic. This is especially true if your red blood cells have gotten low (anemic). The transfusion raises the level of the red blood cells which carry oxygen, and this usually causes an energy increase. The nurse administering the transfusion will monitor you carefully for complications. HOME CARE INSTRUCTIONS  No special instructions are needed after a transfusion. You may find your energy is better. Speak with your caregiver about any limitations on activity for underlying diseases you may have. SEEK MEDICAL CARE IF:  Your condition is not improving after your transfusion. You develop redness or irritation at the intravenous (IV) site. SEEK IMMEDIATE MEDICAL CARE IF:  Any of the following symptoms occur over the next 12 hours: Shaking chills. You have a  temperature by mouth above 102 F (38.9 C), not controlled by medicine. Chest, back, or muscle pain. People around you feel you are not acting correctly or are confused. Shortness of breath or difficulty breathing. Dizziness and fainting. You get a rash or develop hives. You have a decrease in urine output. Your urine turns a dark color or changes to pink, red, or brown. Any of the following symptoms occur over the next 10 days: You have a temperature by mouth above 102 F (38.9 C), not controlled by medicine. Shortness of breath. Weakness after normal activity. The white part of the eye turns yellow (jaundice). You have a decrease in the amount of urine or are urinating less often. Your urine turns a dark color or changes to pink, red, or brown. Document Released: 06/07/2000 Document Revised: 09/02/2011 Document Reviewed: 01/25/2008 [facility] Patient Information 2014 [place], [place].

## 2023-02-28 ENCOUNTER — Encounter (HOSPITAL_COMMUNITY)
Admission: RE | Admit: 2023-02-28 | Discharge: 2023-02-28 | Disposition: A | Discharge status: Home / Self Care | Payer: Medicare Other | Source: Ambulatory Visit | Attending: Orthopedic Surgery | Admitting: Orthopedic Surgery

## 2023-02-28 ENCOUNTER — Encounter (HOSPITAL_COMMUNITY): Payer: Self-pay

## 2023-02-28 ENCOUNTER — Other Ambulatory Visit: Payer: Self-pay

## 2023-02-28 VITALS — BP 152/83 | HR 66 | Temp 97.9°F | Resp 16 | Ht 70.0 in | Wt 254.0 lb

## 2023-02-28 DIAGNOSIS — Z01812 Encounter for preprocedural laboratory examination: Secondary | ICD-10-CM | POA: Insufficient documentation

## 2023-02-28 DIAGNOSIS — Z01818 Encounter for other preprocedural examination: Secondary | ICD-10-CM

## 2023-02-28 HISTORY — DX: Unspecified osteoarthritis, unspecified site: M19.90

## 2023-02-28 LAB — CBC
HCT: 45.7 % (ref 39.0–52.0)
Hemoglobin: 14.8 g/dL (ref 13.0–17.0)
MCH: 29.3 pg (ref 26.0–34.0)
MCHC: 32.4 g/dL (ref 30.0–36.0)
MCV: 90.5 fL (ref 80.0–100.0)
Platelets: 254 10*3/uL (ref 150–400)
RBC: 5.05 MIL/uL (ref 4.22–5.81)
RDW: 13.5 % (ref 11.5–15.5)
WBC: 8.1 10*3/uL (ref 4.0–10.5)
nRBC: 0 % (ref 0.0–0.2)

## 2023-02-28 LAB — TYPE AND SCREEN
ABO/RH(D): A NEG
Antibody Screen: NEGATIVE

## 2023-03-02 LAB — SURGICAL PCR SCREEN
MRSA, PCR: NEGATIVE
Staphylococcus aureus: POSITIVE — AB

## 2023-03-03 NOTE — Progress Notes (Signed)
Please review pt's preop PCR result from 02/28/23.

## 2023-03-04 ENCOUNTER — Ambulatory Visit: Payer: Self-pay | Admitting: Student

## 2023-03-04 NOTE — H&P (Signed)
TOTAL HIP ADMISSION H&P  Patient is admitted for right total hip arthroplasty.  Subjective:  Chief Complaint: right hip pain  HPI: William Lynch, 67 y.o. male, has a history of pain and functional disability in the right hip(s) due to arthritis and patient has failed non-surgical conservative treatments for greater than 12 weeks to include NSAID's and/or analgesics, flexibility and strengthening excercises, use of assistive devices, and activity modification.  Onset of symptoms was gradual starting 10 years ago with rapidlly worsening course since that time.The patient noted no past surgery on the right hip(s).  Patient currently rates pain in the right hip at 10 out of 10 with activity. Patient has night pain, worsening of pain with activity and weight bearing, trendelenberg gait, pain that interfers with activities of daily living, and pain with passive range of motion. Patient has evidence of subchondral cysts, subchondral sclerosis, periarticular osteophytes, and joint space narrowing by imaging studies. This condition presents safety issues increasing the risk of falls. There is no current active infection.  Patient Active Problem List   Diagnosis Date Noted   Pancreatitis 01/10/2020   Obesity (BMI 30.0-34.9) 03/31/2018   Infectious viral hepatitis 04/18/2017   Panic attack 04/18/2017   Headache behind the eyes 06/27/2016   Muscle spasms of neck 03/04/2016   Gagging episode 08/03/2015   Erectile dysfunction 11/21/2014   Psoriasis 11/21/2014   Primary snoring 11/21/2014   Perennial allergic rhinitis with seasonal variation 11/21/2014   Gastroesophageal reflux disease without esophagitis 04/18/2014   Mixed hyperlipidemia 04/18/2014   Past Medical History:  Diagnosis Date   Arthritis    Asthma    childhood   GERD (gastroesophageal reflux disease)    Hyperlipemia    Pancreatitis     Past Surgical History:  Procedure Laterality Date   TONSILLECTOMY      Current Outpatient  Medications  Medication Sig Dispense Refill Last Dose   clobetasol cream (TEMOVATE) 0.05 % Apply 1 application  topically 2 (two) times daily as needed (skin irritation).      ezetimibe (ZETIA) 10 MG tablet Take 10 mg by mouth at bedtime.      ibuprofen (ADVIL) 200 MG tablet Take 400 mg by mouth daily as needed (pain.).      levocetirizine (XYZAL) 5 MG tablet Take 5 mg by mouth in the morning and at bedtime.      pantoprazole (PROTONIX) 40 MG tablet Take 40 mg by mouth in the morning.      tadalafil (CIALIS) 20 MG tablet Take 20 mg by mouth daily as needed for erectile dysfunction.      No current facility-administered medications for this visit.   Allergies  Allergen Reactions   Sulfa Antibiotics Other (See Comments)    Swelling of eyes, with eye drainage   Shellfish Allergy Nausea And Vomiting   Sulfamethoxazole Other (See Comments)    Unsure had medicine as a child Unsure had medicine as a child    Other Nausea And Vomiting    States topical iodine causes no problems. Other reaction(s): Nausea And Vomiting States topical iodine causes no problems.     Social History   Tobacco Use   Smoking status: Never   Smokeless tobacco: Never  Substance Use Topics   Alcohol use: Not Currently    Family History  Problem Relation Age of Onset   Pneumonia Father        with aspiration    Hypertension Father    Aneurysm Father    Cancer Father  Cancer Mother    Bipolar disorder Son      Review of Systems  Musculoskeletal:  Positive for arthralgias and gait problem.  All other systems reviewed and are negative.   Objective:  Physical Exam Constitutional:      Appearance: Normal appearance.  HENT:     Head: Normocephalic and atraumatic.     Nose: Nose normal.     Mouth/Throat:     Mouth: Mucous membranes are moist.     Pharynx: Oropharynx is clear.  Eyes:     Conjunctiva/sclera: Conjunctivae normal.  Cardiovascular:     Rate and Rhythm: Normal rate and regular  rhythm.     Pulses: Normal pulses.     Heart sounds: Normal heart sounds.  Pulmonary:     Effort: Pulmonary effort is normal.     Breath sounds: Normal breath sounds.  Abdominal:     General: Abdomen is flat.     Palpations: Abdomen is soft.  Genitourinary:    Comments: Deferred.  Musculoskeletal:     Cervical back: Normal range of motion and neck supple.     Comments: Examination of the right hip reveals no skin wounds or lesions. Mild trochanteric tenderness to palpation. He has restricted range of motion of the right hip. Pain with terminal flexion and rotation. Pain in the position of impingement. Positive Stinchfield.  Distally, there is no focal motor or sensory deficit. He has palpable pedal pulses.  He ambulates with an antalgic gait.  Skin:    General: Skin is warm and dry.     Capillary Refill: Capillary refill takes less than 2 seconds.  Neurological:     General: No focal deficit present.     Mental Status: He is alert and oriented to person, place, and time.  Psychiatric:        Mood and Affect: Mood normal.        Behavior: Behavior normal.        Thought Content: Thought content normal.        Judgment: Judgment normal.     Vital signs in last 24 hours: @VSRANGES @  Labs:   Estimated body mass index is 36.45 kg/m as calculated from the following:   Height as of 02/28/23: 5\' 10"  (1.778 m).   Weight as of 02/28/23: 115.2 kg.   Imaging Review Plain radiographs demonstrate severe degenerative joint disease of the right hip(s). The bone quality appears to be adequate for age and reported activity level.      Assessment/Plan:  End stage arthritis, right hip(s)  The patient history, physical examination, clinical judgement of the provider and imaging studies are consistent with end stage degenerative joint disease of the right hip(s) and total hip arthroplasty is deemed medically necessary. The treatment options including medical management, injection  therapy, arthroscopy and arthroplasty were discussed at length. The risks and benefits of total hip arthroplasty were presented and reviewed. The risks due to aseptic loosening, infection, stiffness, dislocation/subluxation,  thromboembolic complications and other imponderables were discussed.  The patient acknowledged the explanation, agreed to proceed with the plan and consent was signed. Patient is being admitted for inpatient treatment for surgery, pain control, PT, OT, prophylactic antibiotics, VTE prophylaxis, progressive ambulation and ADL's and discharge planning.The patient is planning to be discharged home with HEP.   Therapy Plans: HEP.  Disposition: Home with wife and son Planned DVT Prophylaxis: aspirin 81mg  BID DME needed: walker and cane.  PCP: Cleared.  TXA: IV Allergies:  - Sulfa antibiotics - unsure  childhood reaction - Shellfish - N/V. Tolerates topical iodine.  Anesthesia Concerns: None.  BMI: 35.4 Last HgbA1c: Not diabetic.  Other: - Hydrocodone, zofran, meloxicam.  - 11/06/22: Hgb 15.1, K+ 4.4, Cr. 1.03.  - 02/28/23: Hgb 14.8. BMP not completed.    Patient's anticipated LOS is less than 2 midnights, meeting these requirements: - Younger than 93 - Lives within 1 hour of care - Has a competent adult at home to recover with post-op recover - NO history of  - Chronic pain requiring opiods  - Diabetes  - Coronary Artery Disease  - Heart failure  - Heart attack  - Stroke  - DVT/VTE  - Cardiac arrhythmia  - Respiratory Failure/COPD  - Renal failure  - Anemia  - Advanced Liver disease

## 2023-03-04 NOTE — H&P (View-Only) (Signed)
TOTAL HIP ADMISSION H&P  Patient is admitted for right total hip arthroplasty.  Subjective:  Chief Complaint: right hip pain  HPI: William Lynch, 67 y.o. male, has a history of pain and functional disability in the right hip(s) due to arthritis and patient has failed non-surgical conservative treatments for greater than 12 weeks to include NSAID's and/or analgesics, flexibility and strengthening excercises, use of assistive devices, and activity modification.  Onset of symptoms was gradual starting 10 years ago with rapidlly worsening course since that time.The patient noted no past surgery on the right hip(s).  Patient currently rates pain in the right hip at 10 out of 10 with activity. Patient has night pain, worsening of pain with activity and weight bearing, trendelenberg gait, pain that interfers with activities of daily living, and pain with passive range of motion. Patient has evidence of subchondral cysts, subchondral sclerosis, periarticular osteophytes, and joint space narrowing by imaging studies. This condition presents safety issues increasing the risk of falls. There is no current active infection.  Patient Active Problem List   Diagnosis Date Noted   Pancreatitis 01/10/2020   Obesity (BMI 30.0-34.9) 03/31/2018   Infectious viral hepatitis 04/18/2017   Panic attack 04/18/2017   Headache behind the eyes 06/27/2016   Muscle spasms of neck 03/04/2016   Gagging episode 08/03/2015   Erectile dysfunction 11/21/2014   Psoriasis 11/21/2014   Primary snoring 11/21/2014   Perennial allergic rhinitis with seasonal variation 11/21/2014   Gastroesophageal reflux disease without esophagitis 04/18/2014   Mixed hyperlipidemia 04/18/2014   Past Medical History:  Diagnosis Date   Arthritis    Asthma    childhood   GERD (gastroesophageal reflux disease)    Hyperlipemia    Pancreatitis     Past Surgical History:  Procedure Laterality Date   TONSILLECTOMY      Current Outpatient  Medications  Medication Sig Dispense Refill Last Dose   clobetasol cream (TEMOVATE) 0.05 % Apply 1 application  topically 2 (two) times daily as needed (skin irritation).      ezetimibe (ZETIA) 10 MG tablet Take 10 mg by mouth at bedtime.      ibuprofen (ADVIL) 200 MG tablet Take 400 mg by mouth daily as needed (pain.).      levocetirizine (XYZAL) 5 MG tablet Take 5 mg by mouth in the morning and at bedtime.      pantoprazole (PROTONIX) 40 MG tablet Take 40 mg by mouth in the morning.      tadalafil (CIALIS) 20 MG tablet Take 20 mg by mouth daily as needed for erectile dysfunction.      No current facility-administered medications for this visit.   Allergies  Allergen Reactions   Sulfa Antibiotics Other (See Comments)    Swelling of eyes, with eye drainage   Shellfish Allergy Nausea And Vomiting   Sulfamethoxazole Other (See Comments)    Unsure had medicine as a child Unsure had medicine as a child    Other Nausea And Vomiting    States topical iodine causes no problems. Other reaction(s): Nausea And Vomiting States topical iodine causes no problems.     Social History   Tobacco Use   Smoking status: Never   Smokeless tobacco: Never  Substance Use Topics   Alcohol use: Not Currently    Family History  Problem Relation Age of Onset   Pneumonia Father        with aspiration    Hypertension Father    Aneurysm Father    Cancer Father  Cancer Mother    Bipolar disorder Son      Review of Systems  Musculoskeletal:  Positive for arthralgias and gait problem.  All other systems reviewed and are negative.   Objective:  Physical Exam Constitutional:      Appearance: Normal appearance.  HENT:     Head: Normocephalic and atraumatic.     Nose: Nose normal.     Mouth/Throat:     Mouth: Mucous membranes are moist.     Pharynx: Oropharynx is clear.  Eyes:     Conjunctiva/sclera: Conjunctivae normal.  Cardiovascular:     Rate and Rhythm: Normal rate and regular  rhythm.     Pulses: Normal pulses.     Heart sounds: Normal heart sounds.  Pulmonary:     Effort: Pulmonary effort is normal.     Breath sounds: Normal breath sounds.  Abdominal:     General: Abdomen is flat.     Palpations: Abdomen is soft.  Genitourinary:    Comments: Deferred.  Musculoskeletal:     Cervical back: Normal range of motion and neck supple.     Comments: Examination of the right hip reveals no skin wounds or lesions. Mild trochanteric tenderness to palpation. He has restricted range of motion of the right hip. Pain with terminal flexion and rotation. Pain in the position of impingement. Positive Stinchfield.  Distally, there is no focal motor or sensory deficit. He has palpable pedal pulses.  He ambulates with an antalgic gait.  Skin:    General: Skin is warm and dry.     Capillary Refill: Capillary refill takes less than 2 seconds.  Neurological:     General: No focal deficit present.     Mental Status: He is alert and oriented to person, place, and time.  Psychiatric:        Mood and Affect: Mood normal.        Behavior: Behavior normal.        Thought Content: Thought content normal.        Judgment: Judgment normal.     Vital signs in last 24 hours: @VSRANGES @  Labs:   Estimated body mass index is 36.45 kg/m as calculated from the following:   Height as of 02/28/23: 5\' 10"  (1.778 m).   Weight as of 02/28/23: 115.2 kg.   Imaging Review Plain radiographs demonstrate severe degenerative joint disease of the right hip(s). The bone quality appears to be adequate for age and reported activity level.      Assessment/Plan:  End stage arthritis, right hip(s)  The patient history, physical examination, clinical judgement of the provider and imaging studies are consistent with end stage degenerative joint disease of the right hip(s) and total hip arthroplasty is deemed medically necessary. The treatment options including medical management, injection  therapy, arthroscopy and arthroplasty were discussed at length. The risks and benefits of total hip arthroplasty were presented and reviewed. The risks due to aseptic loosening, infection, stiffness, dislocation/subluxation,  thromboembolic complications and other imponderables were discussed.  The patient acknowledged the explanation, agreed to proceed with the plan and consent was signed. Patient is being admitted for inpatient treatment for surgery, pain control, PT, OT, prophylactic antibiotics, VTE prophylaxis, progressive ambulation and ADL's and discharge planning.The patient is planning to be discharged home with HEP.   Therapy Plans: HEP.  Disposition: Home with wife and son Planned DVT Prophylaxis: aspirin 81mg  BID DME needed: walker and cane.  PCP: Cleared.  TXA: IV Allergies:  - Sulfa antibiotics - unsure  childhood reaction - Shellfish - N/V. Tolerates topical iodine.  Anesthesia Concerns: None.  BMI: 35.4 Last HgbA1c: Not diabetic.  Other: - Hydrocodone, zofran, meloxicam.  - 11/06/22: Hgb 15.1, K+ 4.4, Cr. 1.03.  - 02/28/23: Hgb 14.8. BMP not completed.    Patient's anticipated LOS is less than 2 midnights, meeting these requirements: - Younger than 41 - Lives within 1 hour of care - Has a competent adult at home to recover with post-op recover - NO history of  - Chronic pain requiring opiods  - Diabetes  - Coronary Artery Disease  - Heart failure  - Heart attack  - Stroke  - DVT/VTE  - Cardiac arrhythmia  - Respiratory Failure/COPD  - Renal failure  - Anemia  - Advanced Liver disease

## 2023-03-12 NOTE — Anesthesia Preprocedure Evaluation (Signed)
Anesthesia Evaluation  Patient identified by MRN, date of birth, ID band Patient awake    Reviewed: Allergy & Precautions, NPO status , Patient's Chart, lab work & pertinent test results  History of Anesthesia Complications Negative for: history of anesthetic complications  Airway Mallampati: II  TM Distance: >3 FB Neck ROM: Full    Dental  (+) Dental Advisory Given, Teeth Intact   Pulmonary asthma    Pulmonary exam normal        Cardiovascular negative cardio ROS Normal cardiovascular exam     Neuro/Psych  Headaches PSYCHIATRIC DISORDERS Anxiety        GI/Hepatic Neg liver ROS,GERD  Medicated and Controlled,,  Endo/Other   Obesity   Renal/GU negative Renal ROS     Musculoskeletal  (+) Arthritis ,    Abdominal   Peds  Hematology negative hematology ROS (+)   Anesthesia Other Findings   Reproductive/Obstetrics                             Anesthesia Physical Anesthesia Plan  ASA: 2  Anesthesia Plan: Spinal   Post-op Pain Management: Tylenol PO (pre-op)*   Induction:   PONV Risk Score and Plan: 1 and Treatment may vary due to age or medical condition and Propofol infusion  Airway Management Planned: Natural Airway and Simple Face Mask  Additional Equipment: None  Intra-op Plan:   Post-operative Plan:   Informed Consent: I have reviewed the patients History and Physical, chart, labs and discussed the procedure including the risks, benefits and alternatives for the proposed anesthesia with the patient or authorized representative who has indicated his/her understanding and acceptance.       Plan Discussed with: CRNA and Anesthesiologist  Anesthesia Plan Comments: (Labs reviewed, platelets acceptable. Discussed risks and benefits of spinal, including spinal/epidural hematoma, infection, failed block, and PDPH. Patient expressed understanding and wished to proceed. )         Anesthesia Quick Evaluation

## 2023-03-13 ENCOUNTER — Encounter (HOSPITAL_COMMUNITY)
Admission: RE | Disposition: A | Discharge status: Home / Self Care | Payer: Self-pay | Source: Home / Self Care | Attending: Orthopedic Surgery

## 2023-03-13 ENCOUNTER — Other Ambulatory Visit: Payer: Self-pay

## 2023-03-13 ENCOUNTER — Encounter (HOSPITAL_COMMUNITY): Payer: Self-pay | Admitting: Orthopedic Surgery

## 2023-03-13 ENCOUNTER — Ambulatory Visit (HOSPITAL_BASED_OUTPATIENT_CLINIC_OR_DEPARTMENT_OTHER): Payer: Medicare Other | Admitting: Anesthesiology

## 2023-03-13 ENCOUNTER — Ambulatory Visit (HOSPITAL_COMMUNITY): Payer: Medicare Other

## 2023-03-13 ENCOUNTER — Ambulatory Visit (HOSPITAL_COMMUNITY): Payer: Medicare Other | Admitting: Anesthesiology

## 2023-03-13 ENCOUNTER — Other Ambulatory Visit (HOSPITAL_COMMUNITY): Payer: Self-pay

## 2023-03-13 ENCOUNTER — Ambulatory Visit (HOSPITAL_COMMUNITY)
Admission: RE | Admit: 2023-03-13 | Discharge: 2023-03-13 | Disposition: A | Discharge status: Home / Self Care | Payer: Medicare Other | Attending: Orthopedic Surgery | Admitting: Orthopedic Surgery

## 2023-03-13 DIAGNOSIS — R2689 Other abnormalities of gait and mobility: Secondary | ICD-10-CM | POA: Diagnosis not present

## 2023-03-13 DIAGNOSIS — K219 Gastro-esophageal reflux disease without esophagitis: Secondary | ICD-10-CM | POA: Insufficient documentation

## 2023-03-13 DIAGNOSIS — J45909 Unspecified asthma, uncomplicated: Secondary | ICD-10-CM | POA: Insufficient documentation

## 2023-03-13 DIAGNOSIS — M2550 Pain in unspecified joint: Secondary | ICD-10-CM | POA: Diagnosis not present

## 2023-03-13 DIAGNOSIS — F419 Anxiety disorder, unspecified: Secondary | ICD-10-CM | POA: Diagnosis not present

## 2023-03-13 DIAGNOSIS — E669 Obesity, unspecified: Secondary | ICD-10-CM | POA: Insufficient documentation

## 2023-03-13 DIAGNOSIS — M1611 Unilateral primary osteoarthritis, right hip: Secondary | ICD-10-CM | POA: Insufficient documentation

## 2023-03-13 DIAGNOSIS — Z6836 Body mass index (BMI) 36.0-36.9, adult: Secondary | ICD-10-CM | POA: Diagnosis not present

## 2023-03-13 DIAGNOSIS — R519 Headache, unspecified: Secondary | ICD-10-CM | POA: Insufficient documentation

## 2023-03-13 HISTORY — PX: TOTAL HIP ARTHROPLASTY: SHX124

## 2023-03-13 LAB — ABO/RH: ABO/RH(D): A NEG

## 2023-03-13 SURGERY — ARTHROPLASTY, HIP, TOTAL, ANTERIOR APPROACH
Anesthesia: Spinal | Site: Hip | Laterality: Right

## 2023-03-13 MED ORDER — KETOROLAC TROMETHAMINE 30 MG/ML IJ SOLN
INTRAMUSCULAR | Status: AC
  Filled 2023-03-13: qty 1

## 2023-03-13 MED ORDER — FAMOTIDINE 20 MG PO TABS
40.0000 mg | ORAL_TABLET | Freq: Two times a day (BID) | ORAL | 2 refills | Status: DC
  Filled 2023-03-13: qty 120, 30d supply, fill #0

## 2023-03-13 MED ORDER — DOCUSATE SODIUM 100 MG PO CAPS
100.0000 mg | ORAL_CAPSULE | Freq: Two times a day (BID) | ORAL | 1 refills | Status: AC
  Filled 2023-03-13: qty 60, 30d supply, fill #0

## 2023-03-13 MED ORDER — FENTANYL CITRATE (PF) 100 MCG/2ML IJ SOLN
INTRAMUSCULAR | Status: DC | PRN
  Administered 2023-03-13: 50 ug via INTRAVENOUS

## 2023-03-13 MED ORDER — LACTATED RINGERS IV SOLN: INTRAVENOUS | Status: DC

## 2023-03-13 MED ORDER — HYDROCODONE-ACETAMINOPHEN 5-325 MG PO TABS
1.0000 | ORAL_TABLET | ORAL | 0 refills | Status: DC | PRN
Start: 2023-03-13 — End: 2024-02-02
  Filled 2023-03-13: qty 40, 7d supply, fill #0

## 2023-03-13 MED ORDER — MIDAZOLAM HCL 5 MG/5ML IJ SOLN
INTRAMUSCULAR | Status: DC | PRN
  Administered 2023-03-13: 1 mg via INTRAVENOUS

## 2023-03-13 MED ORDER — CHLORHEXIDINE GLUCONATE 0.12 % MT SOLN
15.0000 mL | Freq: Once | OROMUCOSAL | Status: AC
  Administered 2023-03-13: 15 mL via OROMUCOSAL

## 2023-03-13 MED ORDER — GLYCOPYRROLATE PF 0.2 MG/ML IJ SOSY
PREFILLED_SYRINGE | INTRAMUSCULAR | Status: DC | PRN
Start: 2023-03-13 — End: 2023-03-13
  Administered 2023-03-13 (×2): .1 mg via INTRAVENOUS

## 2023-03-13 MED ORDER — ASPIRIN 81 MG PO CHEW
81.0000 mg | CHEWABLE_TABLET | Freq: Two times a day (BID) | ORAL | 0 refills | Status: AC
  Filled 2023-03-13: qty 90, 45d supply, fill #0

## 2023-03-13 MED ORDER — ONDANSETRON HCL 4 MG PO TABS
4.0000 mg | ORAL_TABLET | Freq: Three times a day (TID) | ORAL | 0 refills | Status: AC | PRN
  Filled 2023-03-13: qty 20, 7d supply, fill #0

## 2023-03-13 MED ORDER — 0.9 % SODIUM CHLORIDE (POUR BTL) OPTIME
TOPICAL | Status: DC | PRN
Start: 2023-03-13 — End: 2023-03-13
  Administered 2023-03-13: 1000 mL

## 2023-03-13 MED ORDER — METHOCARBAMOL 500 MG IVPB - SIMPLE MED
INTRAVENOUS | Status: AC
  Filled 2023-03-13: qty 55

## 2023-03-13 MED ORDER — ONDANSETRON HCL 4 MG/2ML IJ SOLN
INTRAMUSCULAR | Status: AC
  Filled 2023-03-13: qty 2

## 2023-03-13 MED ORDER — ONDANSETRON HCL 4 MG/2ML IJ SOLN: 4.0000 mg | Freq: Once | INTRAMUSCULAR | Status: DC | PRN

## 2023-03-13 MED ORDER — WATER FOR IRRIGATION, STERILE IR SOLN
Status: DC | PRN
  Administered 2023-03-13: 2000 mL

## 2023-03-13 MED ORDER — FENTANYL CITRATE PF 50 MCG/ML IJ SOSY
PREFILLED_SYRINGE | INTRAMUSCULAR | Status: AC
  Filled 2023-03-13: qty 2

## 2023-03-13 MED ORDER — PRONTOSAN WOUND IRRIGATION OPTIME
TOPICAL | Status: DC | PRN
Start: 2023-03-13 — End: 2023-03-13
  Administered 2023-03-13: 1 via TOPICAL

## 2023-03-13 MED ORDER — BUPIVACAINE-EPINEPHRINE 0.25% -1:200000 IJ SOLN
INTRAMUSCULAR | Status: AC
  Filled 2023-03-13: qty 1

## 2023-03-13 MED ORDER — ISOPROPYL ALCOHOL 70 % SOLN
Status: DC | PRN
  Administered 2023-03-13: 1 via TOPICAL

## 2023-03-13 MED ORDER — MELOXICAM 15 MG PO TABS
15.0000 mg | ORAL_TABLET | Freq: Every day | ORAL | 3 refills | Status: AC
  Filled 2023-03-13: qty 30, 30d supply, fill #0

## 2023-03-13 MED ORDER — PROPOFOL 10 MG/ML IV BOLUS
INTRAVENOUS | Status: DC | PRN
Start: 2023-03-13 — End: 2023-03-13
  Administered 2023-03-13 (×2): 40 mg via INTRAVENOUS
  Administered 2023-03-13: 30 mg via INTRAVENOUS
  Administered 2023-03-13: 40 mg via INTRAVENOUS

## 2023-03-13 MED ORDER — SODIUM CHLORIDE (PF) 0.9 % IJ SOLN
INTRAMUSCULAR | Status: DC | PRN
  Administered 2023-03-13: 61 mL

## 2023-03-13 MED ORDER — ORAL CARE MOUTH RINSE: 15.0000 mL | Freq: Once | OROMUCOSAL | Status: AC

## 2023-03-13 MED ORDER — PROPOFOL 500 MG/50ML IV EMUL
INTRAVENOUS | Status: DC | PRN
Start: 2023-03-13 — End: 2023-03-13
  Administered 2023-03-13: 40 ug/kg/min via INTRAVENOUS
  Administered 2023-03-13: 90 ug/kg/min via INTRAVENOUS

## 2023-03-13 MED ORDER — SODIUM CHLORIDE (PF) 0.9 % IJ SOLN
INTRAMUSCULAR | Status: AC
  Filled 2023-03-13: qty 50

## 2023-03-13 MED ORDER — MIDAZOLAM HCL 2 MG/2ML IJ SOLN
INTRAMUSCULAR | Status: AC
  Filled 2023-03-13: qty 2

## 2023-03-13 MED ORDER — ACETAMINOPHEN 500 MG PO TABS
1000.0000 mg | ORAL_TABLET | Freq: Once | ORAL | Status: DC
  Filled 2023-03-13: qty 2

## 2023-03-13 MED ORDER — OXYCODONE HCL 5 MG PO TABS
5.0000 mg | ORAL_TABLET | Freq: Once | ORAL | Status: AC | PRN
  Administered 2023-03-13: 5 mg via ORAL

## 2023-03-13 MED ORDER — METHOCARBAMOL 500 MG IVPB - SIMPLE MED
500.0000 mg | Freq: Four times a day (QID) | INTRAVENOUS | Status: DC | PRN
  Administered 2023-03-13: 500 mg via INTRAVENOUS

## 2023-03-13 MED ORDER — ONDANSETRON HCL 4 MG/2ML IJ SOLN
INTRAMUSCULAR | Status: DC | PRN
  Administered 2023-03-13: 4 mg via INTRAVENOUS

## 2023-03-13 MED ORDER — CEFAZOLIN SODIUM-DEXTROSE 2-4 GM/100ML-% IV SOLN
2.0000 g | INTRAVENOUS | Status: AC
  Administered 2023-03-13: 2 g via INTRAVENOUS
  Filled 2023-03-13: qty 100

## 2023-03-13 MED ORDER — GLYCOPYRROLATE 0.2 MG/ML IJ SOLN
INTRAMUSCULAR | Status: AC
  Filled 2023-03-13: qty 1

## 2023-03-13 MED ORDER — FENTANYL CITRATE PF 50 MCG/ML IJ SOSY
25.0000 ug | PREFILLED_SYRINGE | INTRAMUSCULAR | Status: DC | PRN
  Administered 2023-03-13 (×2): 50 ug via INTRAVENOUS

## 2023-03-13 MED ORDER — DEXAMETHASONE SODIUM PHOSPHATE 10 MG/ML IJ SOLN
INTRAMUSCULAR | Status: DC | PRN
Start: 2023-03-13 — End: 2023-03-13
  Administered 2023-03-13: 10 mg via INTRAVENOUS

## 2023-03-13 MED ORDER — POVIDONE-IODINE 10 % EX SWAB: 2.0000 | Freq: Once | CUTANEOUS | Status: DC

## 2023-03-13 MED ORDER — LACTATED RINGERS IV BOLUS
250.0000 mL | Freq: Once | INTRAVENOUS | Status: AC
  Administered 2023-03-13: 250 mL via INTRAVENOUS

## 2023-03-13 MED ORDER — ALBUMIN HUMAN 5 % IV SOLN
INTRAVENOUS | Status: DC | PRN
Start: 2023-03-13 — End: 2023-03-13

## 2023-03-13 MED ORDER — POLYETHYLENE GLYCOL 3350 17 GM/SCOOP PO POWD
17.0000 g | Freq: Every day | ORAL | 0 refills | Status: AC | PRN
  Filled 2023-03-13: qty 238, 14d supply, fill #0

## 2023-03-13 MED ORDER — OXYCODONE HCL 5 MG/5ML PO SOLN: 5.0000 mg | Freq: Once | ORAL | Status: AC | PRN

## 2023-03-13 MED ORDER — PROPOFOL 10 MG/ML IV BOLUS
INTRAVENOUS | Status: AC
  Filled 2023-03-13: qty 20

## 2023-03-13 MED ORDER — ACETAMINOPHEN 500 MG PO TABS
1000.0000 mg | ORAL_TABLET | Freq: Once | ORAL | Status: AC
  Administered 2023-03-13: 1000 mg via ORAL

## 2023-03-13 MED ORDER — SENNA 8.6 MG PO TABS
2.0000 | ORAL_TABLET | Freq: Every day | ORAL | 1 refills | Status: AC
  Filled 2023-03-13: qty 60, 30d supply, fill #0

## 2023-03-13 MED ORDER — PROPOFOL 1000 MG/100ML IV EMUL
INTRAVENOUS | Status: AC
  Filled 2023-03-13: qty 100

## 2023-03-13 MED ORDER — FENTANYL CITRATE (PF) 100 MCG/2ML IJ SOLN
INTRAMUSCULAR | Status: AC
  Filled 2023-03-13: qty 2

## 2023-03-13 MED ORDER — SODIUM CHLORIDE 0.9 % IR SOLN
Status: DC | PRN
  Administered 2023-03-13: 1000 mL
  Administered 2023-03-13: 3000 mL

## 2023-03-13 MED ORDER — TRANEXAMIC ACID-NACL 1000-0.7 MG/100ML-% IV SOLN
1000.0000 mg | INTRAVENOUS | Status: AC
  Administered 2023-03-13: 1000 mg via INTRAVENOUS
  Filled 2023-03-13: qty 100

## 2023-03-13 MED ORDER — DEXAMETHASONE SODIUM PHOSPHATE 10 MG/ML IJ SOLN
INTRAMUSCULAR | Status: AC
  Filled 2023-03-13: qty 1

## 2023-03-13 MED ORDER — PHENYLEPHRINE HCL-NACL 20-0.9 MG/250ML-% IV SOLN
INTRAVENOUS | Status: AC
  Filled 2023-03-13: qty 250

## 2023-03-13 MED ORDER — LACTATED RINGERS IV BOLUS
500.0000 mL | Freq: Once | INTRAVENOUS | Status: AC
  Administered 2023-03-13: 500 mL via INTRAVENOUS

## 2023-03-13 MED ORDER — BUPIVACAINE IN DEXTROSE 0.75-8.25 % IT SOLN
INTRATHECAL | Status: DC | PRN
Start: 2023-03-13 — End: 2023-03-13
  Administered 2023-03-13: 1.6 mL via INTRATHECAL

## 2023-03-13 MED ORDER — CEFAZOLIN SODIUM-DEXTROSE 2-4 GM/100ML-% IV SOLN: 2.0000 g | Freq: Four times a day (QID) | INTRAVENOUS | Status: DC

## 2023-03-13 MED ORDER — OXYCODONE HCL 5 MG PO TABS
ORAL_TABLET | ORAL | Status: AC
  Filled 2023-03-13: qty 1

## 2023-03-13 MED ORDER — METHOCARBAMOL 500 MG PO TABS: 500.0000 mg | ORAL_TABLET | Freq: Four times a day (QID) | ORAL | Status: DC | PRN

## 2023-03-13 SURGICAL SUPPLY — 58 items
ADH SKN CLS APL DERMABOND .7 (GAUZE/BANDAGES/DRESSINGS) ×2
APL PRP STRL LF DISP 70% ISPRP (MISCELLANEOUS) ×1
BAG COUNTER SPONGE SURGICOUNT (BAG) IMPLANT
BAG SPEC THK2 15X12 ZIP CLS (MISCELLANEOUS)
BAG SPNG CNTER NS LX DISP (BAG)
BAG ZIPLOCK 12X15 (MISCELLANEOUS) IMPLANT
CHLORAPREP W/TINT 26 (MISCELLANEOUS) ×1 IMPLANT
COVER PERINEAL POST (MISCELLANEOUS) ×1 IMPLANT
COVER SURGICAL LIGHT HANDLE (MISCELLANEOUS) ×1 IMPLANT
DERMABOND ADVANCED .7 DNX12 (GAUZE/BANDAGES/DRESSINGS) ×2 IMPLANT
DRAPE IMP U-DRAPE 54X76 (DRAPES) ×1 IMPLANT
DRAPE SHEET LG 3/4 BI-LAMINATE (DRAPES) ×3 IMPLANT
DRAPE STERI IOBAN 125X83 (DRAPES) ×1 IMPLANT
DRAPE U-SHAPE 47X51 STRL (DRAPES) ×1 IMPLANT
DRESSING AQUACEL AG SP 3.5X10 (GAUZE/BANDAGES/DRESSINGS) IMPLANT
DRSG AQUACEL AG ADV 3.5X10 (GAUZE/BANDAGES/DRESSINGS) ×1 IMPLANT
DRSG AQUACEL AG SP 3.5X10 (GAUZE/BANDAGES/DRESSINGS) ×1
ELECT REM PT RETURN 15FT ADLT (MISCELLANEOUS) ×1 IMPLANT
G7 VIT E NTRL LNR 36 SZG (Miscellaneous) IMPLANT
GAUZE SPONGE 4X4 12PLY STRL (GAUZE/BANDAGES/DRESSINGS) ×1 IMPLANT
GLOVE BIO SURGEON STRL SZ7 (GLOVE) ×1 IMPLANT
GLOVE BIO SURGEON STRL SZ8.5 (GLOVE) ×2 IMPLANT
GLOVE BIOGEL PI IND STRL 7.5 (GLOVE) ×1 IMPLANT
GLOVE BIOGEL PI IND STRL 8.5 (GLOVE) ×1 IMPLANT
GOWN SPEC L3 XXLG W/TWL (GOWN DISPOSABLE) ×1 IMPLANT
GOWN STRL REUS W/ TWL XL LVL3 (GOWN DISPOSABLE) ×1 IMPLANT
GOWN STRL REUS W/TWL XL LVL3 (GOWN DISPOSABLE) ×1
HANDPIECE INTERPULSE COAX TIP (DISPOSABLE) ×1
HEAD CERAMIC BIOLOX 36MM (Head) IMPLANT
HIP SLEEVE BIOLOX -6MM OFFSET (Sleeve) ×1 IMPLANT
HOLDER FOLEY CATH W/STRAP (MISCELLANEOUS) ×1 IMPLANT
HOOD PEEL AWAY T7 (MISCELLANEOUS) ×3 IMPLANT
KIT TURNOVER KIT A (KITS) IMPLANT
MANIFOLD NEPTUNE II (INSTRUMENTS) ×1 IMPLANT
MARKER SKIN DUAL TIP RULER LAB (MISCELLANEOUS) ×1 IMPLANT
NDL SAFETY ECLIP 18X1.5 (MISCELLANEOUS) ×1 IMPLANT
NDL SPNL 18GX3.5 QUINCKE PK (NEEDLE) ×1 IMPLANT
NEEDLE SPNL 18GX3.5 QUINCKE PK (NEEDLE) ×1
PACK ANTERIOR HIP CUSTOM (KITS) ×1 IMPLANT
SAW OSC TIP CART 19.5X105X1.3 (SAW) ×1 IMPLANT
SEALER BIPOLAR AQUA 6.0 (INSTRUMENTS) ×1 IMPLANT
SET HNDPC FAN SPRY TIP SCT (DISPOSABLE) ×1 IMPLANT
SHELL ACET G7 4H 60 SZG (Shell) IMPLANT
SLEEVE HIP BIOLOX -6MM OFFSET (Sleeve) IMPLANT
SOLUTION PRONTOSAN WOUND 350ML (IRRIGATION / IRRIGATOR) ×1 IMPLANT
SPIKE FLUID TRANSFER (MISCELLANEOUS) ×1 IMPLANT
STEM FEM CMTLS 15X115 133D (Stem) IMPLANT
SUT MNCRL AB 3-0 PS2 18 (SUTURE) ×1 IMPLANT
SUT MON AB 2-0 CT1 36 (SUTURE) ×1 IMPLANT
SUT STRATAFIX PDO 1 14 VIOLET (SUTURE) ×1
SUT STRATFX PDO 1 14 VIOLET (SUTURE) ×1
SUT VIC AB 2-0 CT1 27 (SUTURE)
SUT VIC AB 2-0 CT1 TAPERPNT 27 (SUTURE) IMPLANT
SUTURE STRATFX PDO 1 14 VIOLET (SUTURE) ×1 IMPLANT
SYR 3ML LL SCALE MARK (SYRINGE) ×1 IMPLANT
TRAY FOLEY MTR SLVR 16FR STAT (SET/KITS/TRAYS/PACK) IMPLANT
TUBE SUCTION HIGH CAP CLEAR NV (SUCTIONS) ×1 IMPLANT
WATER STERILE IRR 1000ML POUR (IV SOLUTION) ×1 IMPLANT

## 2023-03-13 NOTE — Transfer of Care (Signed)
Immediate Anesthesia Transfer of Care Note  Patient: William Lynch  Procedure(s) Performed: TOTAL HIP ARTHROPLASTY ANTERIOR APPROACH (Right: Hip)  Patient Location: PACU  Anesthesia Type:Spinal  Level of Consciousness: awake, alert , patient cooperative, and responds to stimulation  Airway & Oxygen Therapy: Patient Spontanous Breathing and Patient connected to face mask oxygen  Post-op Assessment: Report given to RN, Post -op Vital signs reviewed and stable, and Patient moving all extremities X 4  Post vital signs: Reviewed and stable  Last Vitals:  Vitals Value Taken Time  BP 112/77 03/13/23 0947  Temp    Pulse 100 03/13/23 0951  Resp 20 03/13/23 0951  SpO2 97 % 03/13/23 0951  Vitals shown include unfiled device data.  Last Pain:  Vitals:   03/13/23 0603  TempSrc: Oral  PainSc:          Complications: No notable events documented.

## 2023-03-13 NOTE — Op Note (Signed)
OPERATIVE REPORT  SURGEON: Samson Frederic, MD   ASSISTANT: Clint Bolder, PA-C.  PREOPERATIVE DIAGNOSIS: Right hip arthritis.   POSTOPERATIVE DIAGNOSIS: Right hip arthritis.   PROCEDURE: Right total hip arthroplasty, anterior approach.   IMPLANTS: Biomet Taperloc Complete Microplasty stem, size 15 x 115 mm, high offset. Biomet G7 OsseoTi Cup, size 60 mm. Biomet Vivacit-E liner, size 36 mm, G, neutral. Biomet Biolox ceramic head ball, size 36 - 6 mm.  ANESTHESIA:  MAC and Spinal  ESTIMATED BLOOD LOSS:-300 mL    ANTIBIOTICS: 2g Ancef.  DRAINS: None.  COMPLICATIONS: None.   CONDITION: PACU - hemodynamically stable.   BRIEF CLINICAL NOTE: William Lynch is a 67 y.o. male with a long-standing history of Right hip arthritis. After failing conservative management, the patient was indicated for total hip arthroplasty. The risks, benefits, and alternatives to the procedure were explained, and the patient elected to proceed.  PROCEDURE IN DETAIL: Surgical site was marked by myself in the pre-op holding area. Once inside the operating room, spinal anesthesia was obtained, and a foley catheter was inserted. The patient was then positioned on the Hana table.  All bony prominences were well padded.  The hip was prepped and draped in the normal sterile surgical fashion.  A time-out was called verifying side and site of surgery. The patient received IV antibiotics within 60 minutes of beginning the procedure.   Bikini incision was made, and superficial dissection was performed lateral to the ASIS. The direct anterior approach to the hip was performed through the Hueter interval.  Lateral femoral circumflex vessels were treated with the Auqumantys. The anterior capsule was exposed and an inverted T capsulotomy was made. The femoral neck cut was made to the level of the templated cut.  A corkscrew was placed into the head and the head was removed.  The femoral head was found to have eburnated  bone. The head was passed to the back table and was measured. Pubofemoral ligament was released off of the calcar, taking care to stay on bone. Superior capsule was released from the greater trochanter, taking care to stay lateral to the posterior border of the femoral neck in order to preserve the short external rotators.   Acetabular exposure was achieved, and the pulvinar and labrum were excised. Sequential reaming of the acetabulum was then performed up to a size 59 mm reamer. A 60 mm cup was then opened and impacted into place at approximately 40 degrees of abduction and 20 degrees of anteversion. The final polyethylene liner was impacted into place and acetabular osteophytes were removed.    I then gained femoral exposure taking care to protect the abductors and greater trochanter.  This was performed using standard external rotation, extension, and adduction.  A cookie cutter was used to enter the femoral canal, and then the femoral canal finder was placed.  Sequential broaching was performed up to a size 15.  Calcar planer was used on the femoral neck remnant.  I placed a high offset neck and a trial head ball.  The hip was reduced.  Leg lengths and offset were checked fluoroscopically.  The hip was dislocated and trial components were removed.  The final implants were placed, and the hip was reduced.  Fluoroscopy was used to confirm component position and leg lengths.  At 90 degrees of external rotation and full extension, the hip was stable to an anterior directed force.   The wound was copiously irrigated with Prontosan solution and normal saline using pulse lavage.  Marcaine solution was injected into the periarticular soft tissue.  The wound was closed in layers using #1 Vicryl and V-Loc for the fascia, 2-0 Vicryl for the subcutaneous fat, 2-0 Monocryl for the deep dermal layer, 3-0 running Monocryl subcuticular stitch, and Dermabond for the skin.  Once the glue was fully dried, an Aquacell Ag  dressing was applied.  The patient was transported to the recovery room in stable condition.  Sponge, needle, and instrument counts were correct at the end of the case x2.  The patient tolerated the procedure well and there were no known complications.  The aquamantis was utilized for this case to help facilitate better hemostasis as patient was felt to be at increased risk of bleeding because of complex case requiring increased OR time and/or exposure - minimally invasive approach.  A oscillating saw tip was utilized for this case to prevent damage to the soft tissue structures such as muscles, ligaments and tendons, and to ensure accurate bone cuts. This patient was at increased risk for above structures due to  minimally invasive approach.  Please note that a surgical assistant was a medical necessity for this procedure to perform it in a safe and expeditious manner. Assistant was necessary to provide appropriate retraction of vital neurovascular structures, to prevent femoral fracture, and to allow for anatomic placement of the prosthesis.

## 2023-03-13 NOTE — Interval H&P Note (Signed)
History and Physical Interval Note:  03/13/2023 6:52 AM  William Lynch  has presented today for surgery, with the diagnosis of Right hip osteoarthritis.  The various methods of treatment have been discussed with the patient and family. After consideration of risks, benefits and other options for treatment, the patient has consented to  Procedure(s) with comments: TOTAL HIP ARTHROPLASTY ANTERIOR APPROACH (Right) - 130 as a surgical intervention.  The patient's history has been reviewed, patient examined, no change in status, stable for surgery.  I have reviewed the patient's chart and labs.  Questions were answered to the patient's satisfaction.     William Lynch

## 2023-03-13 NOTE — Anesthesia Procedure Notes (Signed)
Spinal  Patient location during procedure: OR Start time: 03/13/2023 7:34 AM End time: 03/13/2023 7:37 AM Reason for block: surgical anesthesia Staffing Performed: anesthesiologist  Anesthesiologist: Beryle Lathe, MD Performed by: Beryle Lathe, MD Authorized by: Beryle Lathe, MD   Preanesthetic Checklist Completed: patient identified, IV checked, risks and benefits discussed, surgical consent, monitors and equipment checked, pre-op evaluation and timeout performed Spinal Block Patient position: sitting Prep: DuraPrep Patient monitoring: heart rate, cardiac monitor, continuous pulse ox and blood pressure Approach: midline Location: L3-4 Injection technique: single-shot Needle Needle type: Pencan  Needle gauge: 24 G Additional Notes Consent was obtained prior to the procedure with all questions answered and concerns addressed. Risks including, but not limited to, bleeding, infection, nerve damage, paralysis, failed block, inadequate analgesia, allergic reaction, high spinal, itching, and headache were discussed and the patient wished to proceed. Functioning IV was confirmed and monitors were applied. Sterile prep and drape, including hand hygiene, mask, and sterile gloves were used. The patient was positioned and the spine was prepped. The skin was anesthetized with lidocaine. Free flow of clear CSF was obtained prior to injecting local anesthetic into the CSF. The spinal needle aspirated freely following injection. The needle was carefully withdrawn. The patient tolerated the procedure well.   Leslye Peer, MD

## 2023-03-13 NOTE — Anesthesia Procedure Notes (Signed)
Date/Time: 03/13/2023 7:29 AM  Performed by: Shary Decamp, CRNAPre-anesthesia Checklist: Patient identified, Emergency Drugs available, Suction available, Timeout performed and Patient being monitored Patient Re-evaluated:Patient Re-evaluated prior to induction Oxygen Delivery Method: Simple face mask

## 2023-03-13 NOTE — Anesthesia Postprocedure Evaluation (Signed)
Anesthesia Post Note  Patient: William Lynch  Procedure(s) Performed: TOTAL HIP ARTHROPLASTY ANTERIOR APPROACH (Right: Hip)     Patient location during evaluation: PACU Anesthesia Type: Spinal Level of consciousness: awake and alert Pain management: pain level controlled Vital Signs Assessment: post-procedure vital signs reviewed and stable Respiratory status: spontaneous breathing and respiratory function stable Cardiovascular status: blood pressure returned to baseline and stable Postop Assessment: spinal receding and no apparent nausea or vomiting Anesthetic complications: no   No notable events documented.  Last Vitals:  Vitals:   03/13/23 1015 03/13/23 1030  BP: 137/87 116/83  Pulse: 95 72  Resp: 18 17  Temp: 36.4 C   SpO2: 97% 95%    Last Pain:  Vitals:   03/13/23 1030  TempSrc:   PainSc: 3                  Beryle Lathe

## 2023-03-13 NOTE — Evaluation (Signed)
Physical Therapy Evaluation Patient Details Name: William Lynch MRN: [id number] DOB: 02-19-1956 Today's Date: 03/13/2023  History of Present Illness  Pt s/p R THR  Clinical Impression  Pt s/p R THR and presents with decreased R LE strength/ROM and post op pain limiting functional mobility.  This date, pt performed therex program with assist, reviewed written HEP, reviewed car transfers, up to ambulate in hall and negotiated stairs.  Son and wife present and written instruction provided.  Pt eager for dc home this date.      If plan is discharge home, recommend the following: A little help with walking and/or transfers;A little help with bathing/dressing/bathroom;Assistance with cooking/housework;Assist for transportation;Help with stairs or ramp for entrance   Can travel by private vehicle        Equipment Recommendations Rolling walker (2 wheels)  Recommendations for Other Services       Functional Status Assessment Patient has had a recent decline in their functional status and demonstrates the ability to make significant improvements in function in a reasonable and predictable amount of time.     Precautions / Restrictions Precautions Precautions: Fall Restrictions Weight Bearing Restrictions: No Other Position/Activity Restrictions: WBAT      Mobility  Bed Mobility Overal bed mobility: Needs Assistance Bed Mobility: Supine to Sit     Supine to sit: Contact guard     General bed mobility comments: cues for sequence and use of L LE to self assist    Transfers Overall transfer level: Needs assistance Equipment used: Rolling walker (2 wheels) Transfers: Sit to/from Stand Sit to Stand: Contact guard assist           General transfer comment: cues for LE management and use of UEs to self assist    Ambulation/Gait Ambulation/Gait assistance: Min assist, Contact guard assist Gait Distance (Feet): 120 Feet Assistive device: Rolling walker (2 wheels) Gait  Pattern/deviations: Step-to pattern, Decreased step length - right, Decreased step length - left, Shuffle, Trunk flexed Gait velocity: decr     General Gait Details: cues for sequence, posture and position from RW  Stairs Stairs: Yes Stairs assistance: Min assist Stair Management: No rails, Step to pattern, Backwards, Forwards Number of Stairs: 3 General stair comments: single step x 3 - once bkwd and twice fwd; cues for sequence  Wheelchair Mobility     Tilt Bed    Modified Rankin (Stroke Patients Only)       Balance Overall balance assessment: Needs assistance Sitting-balance support: No upper extremity supported, Feet supported Sitting balance-Leahy Scale: Good     Standing balance support: Bilateral upper extremity supported Standing balance-Leahy Scale: Poor                               Pertinent Vitals/Pain Pain Assessment Pain Assessment: 0-10 Pain Score: 4  Pain Location: R hip Pain Descriptors / Indicators: Aching, Sore Pain Intervention(s): Limited activity within patient's tolerance, Monitored during session    Home Living Family/patient expects to be discharged to:: Private residence Living Arrangements: Spouse/significant other Available Help at Discharge: Family;Available 24 hours/day Type of Home: House Home Access: Stairs to enter   [employer] of Steps: 1   Home Layout: One level Home Equipment: None      Prior Function Prior Level of Function : Independent/Modified Independent                     Extremity/Trunk Assessment   Upper Extremity  Assessment Upper Extremity Assessment: Overall WFL for tasks assessed    Lower Extremity Assessment Lower Extremity Assessment: RLE deficits/detail RLE Deficits / Details: AAROM R hip to 85 flex and 15 abd;    Cervical / Trunk Assessment Cervical / Trunk Assessment: Normal  Communication   Communication Communication: No apparent difficulties  Cognition  Arousal: Alert Behavior During Therapy: WFL for tasks assessed/performed Overall Cognitive Status: Within Functional Limits for tasks assessed                                          General Comments      Exercises Total Joint Exercises Ankle Circles/Pumps: AROM, Both, 15 reps, Supine Quad Sets: AROM, Both, 10 reps, Other reps (comment) Heel Slides: AAROM, Right, 15 reps, Supine Hip ABduction/ADduction: AAROM, Right, 10 reps, Supine Long Arc Quad: AROM, Right, 10 reps, Seated   Assessment/Plan    PT Assessment Patient needs continued PT services  PT Problem List Decreased activity tolerance;Decreased strength;Decreased range of motion;Decreased balance;Decreased mobility;Decreased knowledge of use of DME;Pain       PT Treatment Interventions DME instruction;Gait training;Stair training;Functional mobility training;Therapeutic activities;Therapeutic exercise;Patient/family education    PT Goals (Current goals can be found in the Care Plan section)  Acute Rehab PT Goals Patient Stated Goal: Regain IND PT Goal Formulation: All assessment and education complete, DC therapy    Frequency Min 1X/week     Co-evaluation               AM-PAC PT "6 Clicks" Mobility  Outcome Measure Help needed turning from your back to your side while in a flat bed without using bedrails?: A Little Help needed moving from lying on your back to sitting on the side of a flat bed without using bedrails?: A Little Help needed moving to and from a bed to a chair (including a wheelchair)?: A Little Help needed standing up from a chair using your arms (e.g., wheelchair or bedside chair)?: A Little Help needed to walk in hospital room?: A Little Help needed climbing 3-5 steps with a railing? : A Little 6 Click Score: 18    End of Session Equipment Utilized During Treatment: Gait belt Activity Tolerance: Patient tolerated treatment well Patient left: in chair;with call bell/phone  within reach;with family/visitor present Nurse Communication: Mobility status PT Visit Diagnosis: Difficulty in walking, not elsewhere classified (R26.2)    Time: 3244-0102 PT Time Calculation (min) (ACUTE ONLY): 49 min   Charges:   PT Evaluation $PT Eval Low Complexity: 1 Low PT Treatments $Gait Training: 8-22 mins $Therapeutic Exercise: 8-22 mins PT General Charges $$ ACUTE PT VISIT: 1 Visit         Mauro Kaufmann PT Acute Rehabilitation Services Pager [phone] Office [phone]   Ayriel Texidor 03/13/2023, 12:44 PM

## 2023-03-13 NOTE — Discharge Instructions (Signed)
? ?Dr. Miles Leyda ?Joint Replacement Specialist ?[facility] ?[address] ?[place], [place] [address] ?[phone] ? ? ?TOTAL HIP REPLACEMENT POSTOPERATIVE DIRECTIONS ? ? ? ?Hip Rehabilitation, Guidelines Following Surgery  ? ?WEIGHT BEARING ?Weight bearing as tolerated with assist device (walker, cane, etc) as directed, use it as long as suggested by your surgeon or therapist, typically at least 4-6 weeks. ? ?The results of a hip operation are greatly improved after range of motion and muscle strengthening exercises. Follow all safety measures which are given to protect your hip. If any of these exercises cause increased pain or swelling in your joint, decrease the amount until you are comfortable again. Then slowly increase the exercises. Call your caregiver if you have problems or questions.  ? ?HOME CARE INSTRUCTIONS  ?Most of the following instructions are designed to prevent the dislocation of your new hip.  ?Remove items at home which could result in a fall. This includes throw rugs or furniture in walking pathways.  ?Continue medications as instructed at time of discharge. ?You may have some home medications which will be placed on hold until you complete the course of blood thinner medication. ?You may start showering once you are discharged home. Do not remove your dressing. ?Do not put on socks or shoes without following the instructions of your caregivers.   ?Sit on chairs with arms. Use the chair arms to help push yourself up when arising.  ?Arrange for the use of a toilet seat elevator so you are not sitting low.  ?Walk with walker as instructed.  ?You may resume a sexual relationship in one month or when given the OK by your caregiver.  ?Use walker as long as suggested by your caregivers.  ?You may put full weight on your legs and walk as much as is comfortable. ?Avoid periods of inactivity such as sitting longer than an hour when not asleep. This helps prevent blood  clots.  ?You may return to work once you are cleared by your surgeon.  ?Do not drive a car for 6 weeks or until released by your surgeon.  ?Do not drive while taking narcotics.  ?Wear elastic stockings for two weeks following surgery during the day but you may remove then at night.  ?Make sure you keep all of your appointments after your operation with all of your doctors and caregivers. You should call the office at the above phone number and make an appointment for approximately two weeks after the date of your surgery. ?Please pick up a stool softener and laxative for home use as long as you are requiring pain medications. ?ICE to the affected hip every three hours for 30 minutes at a time and then as needed for pain and swelling. Continue to use ice on the hip for pain and swelling from surgery. You may notice swelling that will progress down to the foot and ankle.  This is normal after surgery.  Elevate the leg when you are not up walking on it.   ?It is important for you to complete the blood thinner medication as prescribed by your doctor. ?Continue to use the breathing machine which will help keep your temperature down.  It is common for your temperature to cycle up and down following surgery, especially at night when you are not up moving around and exerting yourself.  The breathing machine keeps your lungs expanded and your temperature down. ? ?RANGE OF MOTION AND STRENGTHENING EXERCISES  ?These exercises are designed to help you   keep full movement of your hip joint. Follow your caregiver's or physical therapist's instructions. Perform all exercises about fifteen times, three times per day or as directed. Exercise both hips, even if you have had only one joint replacement. These exercises can be done on a training (exercise) mat, on the floor, on a table or on a bed. Use whatever works the best and is most comfortable for you. Use music or television while you are exercising so that the exercises are a  pleasant break in your day. This will make your life better with the exercises acting as a break in routine you can look forward to.  ?Lying on your back, slowly slide your foot toward your buttocks, raising your knee up off the floor. Then slowly slide your foot back down until your leg is straight again.  ?Lying on your back spread your legs as far apart as you can without causing discomfort.  ?Lying on your side, raise your upper leg and foot straight up from the floor as far as is comfortable. Slowly lower the leg and repeat.  ?Lying on your back, tighten up the muscle in the front of your thigh (quadriceps muscles). You can do this by keeping your leg straight and trying to raise your heel off the floor. This helps strengthen the largest muscle supporting your knee.  ?Lying on your back, tighten up the muscles of your buttocks both with the legs straight and with the knee bent at a comfortable angle while keeping your heel on the floor.  ? ?SKILLED REHAB INSTRUCTIONS: ?If the patient is transferred to a skilled rehab facility following release from the hospital, a list of the current medications will be sent to the facility for the patient to continue.  When discharged from the skilled rehab facility, please have the facility set up the patient's Home Health Physical Therapy prior to being released. Also, the skilled facility will be responsible for providing the patient with their medications at time of release from the facility to include their pain medication and their blood thinner medication. If the patient is still at the rehab facility at time of the two week follow up appointment, the skilled rehab facility will also need to assist the patient in arranging follow up appointment in our office and any transportation needs. ? ?POST-OPERATIVE OPIOID TAPER INSTRUCTIONS: ?It is important to wean off of your opioid medication as soon as possible. If you do not need pain medication after your surgery it is ok  to stop day one. ?Opioids include: ?Codeine, Hydrocodone(Norco, Vicodin), Oxycodone(Percocet, oxycontin) and hydromorphone amongst others.  ?Long term and even short term use of opiods can cause: ?Increased pain response ?Dependence ?Constipation ?Depression ?Respiratory depression ?And more.  ?Withdrawal symptoms can include ?Flu like symptoms ?Nausea, vomiting ?And more ?Techniques to manage these symptoms ?Hydrate well ?Eat regular healthy meals ?Stay active ?Use relaxation techniques(deep breathing, meditating, yoga) ?Do Not substitute Alcohol to help with tapering ?If you have been on opioids for less than two weeks and do not have pain than it is ok to stop all together.  ?Plan to wean off of opioids ?This plan should start within one week post op of your joint replacement. ?Maintain the same interval or time between taking each dose and first decrease the dose.  ?Cut the total daily intake of opioids by one tablet each day ?Next start to increase the time between doses. ?The last dose that should be eliminated is the evening dose.  ? ? ?MAKE   SURE YOU:  ?Understand these instructions.  ?Will watch your condition.  ?Will get help right away if you are not doing well or get worse. ? ?Pick up stool softner and laxative for home use following surgery while on pain medications. ?Do not remove your dressing. ?The dressing is waterproof--it is OK to take showers. ?Continue to use ice for pain and swelling after surgery. ?Do not use any lotions or creams on the incision until instructed by your surgeon. ?Total Hip Protocol. ? ?

## 2023-03-14 ENCOUNTER — Encounter (HOSPITAL_COMMUNITY): Payer: Self-pay | Admitting: Orthopedic Surgery

## 2023-08-05 ENCOUNTER — Ambulatory Visit (INDEPENDENT_AMBULATORY_CARE_PROVIDER_SITE_OTHER): Payer: Medicare Other | Admitting: Family Medicine

## 2023-08-05 ENCOUNTER — Encounter: Payer: Self-pay | Admitting: Family Medicine

## 2023-08-05 VITALS — BP 132/86 | HR 64 | Ht 70.0 in | Wt 263.2 lb

## 2023-08-05 DIAGNOSIS — E782 Mixed hyperlipidemia: Secondary | ICD-10-CM | POA: Diagnosis not present

## 2023-08-05 DIAGNOSIS — R198 Other specified symptoms and signs involving the digestive system and abdomen: Secondary | ICD-10-CM | POA: Diagnosis not present

## 2023-08-05 DIAGNOSIS — K219 Gastro-esophageal reflux disease without esophagitis: Secondary | ICD-10-CM

## 2023-08-05 DIAGNOSIS — D692 Other nonthrombocytopenic purpura: Secondary | ICD-10-CM | POA: Insufficient documentation

## 2023-08-05 DIAGNOSIS — D2372 Other benign neoplasm of skin of left lower limb, including hip: Secondary | ICD-10-CM | POA: Insufficient documentation

## 2023-08-05 DIAGNOSIS — M25551 Pain in right hip: Secondary | ICD-10-CM

## 2023-08-05 DIAGNOSIS — D225 Melanocytic nevi of trunk: Secondary | ICD-10-CM | POA: Insufficient documentation

## 2023-08-05 DIAGNOSIS — Z79899 Other long term (current) drug therapy: Secondary | ICD-10-CM

## 2023-08-05 DIAGNOSIS — D2272 Melanocytic nevi of left lower limb, including hip: Secondary | ICD-10-CM | POA: Insufficient documentation

## 2023-08-05 DIAGNOSIS — L821 Other seborrheic keratosis: Secondary | ICD-10-CM | POA: Insufficient documentation

## 2023-08-05 DIAGNOSIS — E669 Obesity, unspecified: Secondary | ICD-10-CM | POA: Insufficient documentation

## 2023-08-05 DIAGNOSIS — M4310 Spondylolisthesis, site unspecified: Secondary | ICD-10-CM | POA: Insufficient documentation

## 2023-08-05 DIAGNOSIS — Z808 Family history of malignant neoplasm of other organs or systems: Secondary | ICD-10-CM | POA: Insufficient documentation

## 2023-08-05 NOTE — Progress Notes (Signed)
Patient Office Visit  Assessment & Plan:  Mixed hyperlipidemia  Gagging episode  Taking a statin medication  Right hip pain  Gastroesophageal reflux disease without esophagitis  Obesity (BMI 30-39.9)   Test results were reviewed and analyzed as part of the medical decision making of this visit.  Reviewed notes from previous primary care at [facility].  Reviewed orthopedic notes. RTC 6 months or sooner if necessary.  Continue healthy diet start regular exercise program gradual weight loss.  She can use over-the-counter Voltaren gel on the right hip if necessary.  Patient is also aware that weight loss will help with the hip pain/discomfort. No follow-ups on file.   Subjective:    Patient ID: William Lynch, male    DOB: July 25, 1955  Age: 68 y.o. MRN: [id number]  Chief Complaint  Patient presents with   Establish Care    HPI Hyperlipidemia-denies unusual muscle aches or muscle cramps or difficulty tolerating statin therapy.  Aware of need for diet control, exercise and healthy eating.  Patient is aware not to consume grapefruit juice or pomegranate juice.  Patient does cook more at home however last night it went to [facility] for steak dinner.  Patient has not resumed his usual walking regimen but will do so.  Patient did gain weight during the holidays and needs to lose some weight. Right hip pain- Pt had hip replacement last year but still having some discomfort. Pt has some numbness right lateral side. It is Not waking him up.  Patient is active inside the house however has not resumed his regular walking outside but will do so. Not walking outside yet.Pt has not used OTC Voltaren gel  GERD: Pt is taking Pantoprazole 40 mg daily without any complications. No nausea vomiting hematemesis medic easier. Patient not having breakthrough symptoms. Patient does have a strong gag reflex which has been an ongoing issue for a long time  The 10-year ASCVD risk score (Arnett  DK, et al., 2019) is: 15.2%  Past Medical History:  Diagnosis Date   Arthritis    Asthma    childhood   GERD (gastroesophageal reflux disease)    Hyperlipemia    Pancreatitis    Past Surgical History:  Procedure Laterality Date   TONSILLECTOMY     TOTAL HIP ARTHROPLASTY Right 03/13/2023   Procedure: TOTAL HIP ARTHROPLASTY ANTERIOR APPROACH;  Surgeon: Samson Frederic, MD;  Location: [facility];  Service: Orthopedics;  Laterality: Right;  130   Social History   Tobacco Use   Smoking status: Never   Smokeless tobacco: Never  Substance Use Topics   Alcohol use: Not Currently   Drug use: Never   Family History  Problem Relation Age of Onset   Cancer Mother    Pneumonia Father        with aspiration    Hypertension Father    Aneurysm Father    Cancer Father    Bipolar disorder Son    Allergies  Allergen Reactions   Sulfa Antibiotics Other (See Comments)    Swelling of eyes, with eye drainage   Shellfish Allergy Nausea And Vomiting   Sulfamethoxazole Other (See Comments)    Unsure had medicine as a child Unsure had medicine as a child    Other Nausea And Vomiting    States topical iodine causes no problems. Other reaction(s): Nausea And Vomiting States topical iodine causes no problems.     ROS    Objective:    BP 132/86   Pulse 64  Ht 5\' 10"  (1.778 m)   Wt 263 lb 3.2 oz (119.4 kg)   SpO2 98%   BMI 37.77 kg/m  BP Readings from Last 3 Encounters:  08/05/23 132/86  03/13/23 132/80  02/28/23 (!) 152/83   Wt Readings from Last 3 Encounters:  08/05/23 263 lb 3.2 oz (119.4 kg)  03/13/23 254 lb (115.2 kg)  02/28/23 254 lb (115.2 kg)    Physical Exam Vitals and nursing note reviewed.  Constitutional:      Appearance: Normal appearance.  HENT:     Head: Normocephalic.     Right Ear: Tympanic membrane, ear canal and external ear normal.     Left Ear: Tympanic membrane, ear canal and external ear normal.  Eyes:     Extraocular Movements: Extraocular  movements intact.     Conjunctiva/sclera: Conjunctivae normal.     Pupils: Pupils are equal, round, and reactive to light.  Cardiovascular:     Rate and Rhythm: Normal rate and regular rhythm.     Heart sounds: Normal heart sounds.  Pulmonary:     Effort: Pulmonary effort is normal.     Breath sounds: Normal breath sounds.  Musculoskeletal:     Right lower leg: No edema.     Left lower leg: No edema.  Neurological:     General: No focal deficit present.     Mental Status: He is alert and oriented to person, place, and time.  Psychiatric:        Mood and Affect: Mood normal.        Behavior: Behavior normal.        Thought Content: Thought content normal.        Judgment: Judgment normal.      No results found for any visits on 08/05/23.

## 2023-10-21 ENCOUNTER — Other Ambulatory Visit: Payer: Self-pay

## 2023-10-21 MED ORDER — PANTOPRAZOLE SODIUM 40 MG PO TBEC: 40.0000 mg | DELAYED_RELEASE_TABLET | Freq: Every morning | ORAL | 0 refills | Status: DC

## 2023-10-21 MED ORDER — EZETIMIBE 10 MG PO TABS: 10.0000 mg | ORAL_TABLET | Freq: Every day | ORAL | 0 refills | Status: DC

## 2024-01-15 ENCOUNTER — Encounter: Payer: Self-pay | Admitting: Family Medicine

## 2024-01-15 ENCOUNTER — Other Ambulatory Visit: Payer: Self-pay | Admitting: Family Medicine

## 2024-02-02 ENCOUNTER — Ambulatory Visit: Payer: Medicare Other | Admitting: Family Medicine

## 2024-02-02 ENCOUNTER — Encounter: Payer: Self-pay | Admitting: Family Medicine

## 2024-02-02 VITALS — BP 138/90 | HR 69 | Temp 98.3°F | Ht 70.0 in | Wt 253.2 lb

## 2024-02-02 DIAGNOSIS — Z0000 Encounter for general adult medical examination without abnormal findings: Secondary | ICD-10-CM

## 2024-02-02 DIAGNOSIS — E782 Mixed hyperlipidemia: Secondary | ICD-10-CM | POA: Diagnosis not present

## 2024-02-02 DIAGNOSIS — K219 Gastro-esophageal reflux disease without esophagitis: Secondary | ICD-10-CM | POA: Diagnosis not present

## 2024-02-02 NOTE — Progress Notes (Signed)
 Patient Office Visit  Assessment & Plan:  Encounter for Medicare annual wellness exam  Gastroesophageal reflux disease without esophagitis -     CBC with Differential/Platelet  Mixed hyperlipidemia -     Comprehensive metabolic panel with GFR -     Lipid panel   Assessment and Plan        Hyperlipidemia Managed with Zetia . Stiffness less severe than with simvastatin, symptoms tolerable. - Continue Zetia .  Obesity Weight decreased from 263 lbs to 253 lbs. - Encourage healthy eating and physical activity.  Gastroesophageal reflux disease (GERD) Reflux medication effectively managing symptoms.   Stress, not dysfunctional Experiencing stress related to personal circumstances, not dysfunctional. Managing stress despite feeling more annoyed.    Return in 1 year (on 02/01/2025).   Subjective:    Patient ID: William Lynch, male    DOB: 07/06/1955  Age: 68 y.o. MRN: [id number]  Chief Complaint  Patient presents with   Medicare Wellness    HPI Discussed the use of AI scribe software for clinical note transcription with the patient, who gave verbal consent to proceed.  History of Present Illness        William Lynch is a 68 year old male who presents for a follow-up visit regarding his cholesterol management and medicare wellness exam subsequent.   He is currently taking Zetia  for cholesterol management. While it is not as effective in lowering his cholesterol as previous medications, it is more tolerable than simvastatin, which caused significant stiffness. He notes that the stiffness with Zetia  is noticeable but much less severe than with simvastatin. patient also tried other statins with similar negative side effects.   His reflux medication remains effective. He is attempting to eat healthily and stay active, although he is unsure about his current weight. He believes his weight has decreased, noting a recent weight of 253 pounds, down from 263 pounds. He  attributes this weight change to an 'accident' rather than any drastic lifestyle changes.  He is up to date on vaccinations, including COVID, flu, and shingles. He had a colonoscopy in 2017 at [facility] and sees a dermatologist, Dr. Robinson, annually, with a visit due in January.  He describes experiencing some stress related to personal situations but states it is manageable. He has had a recent spell of sleep disturbances, which has improved over the last week. He has not traveled recently due to various constraints but plans to resume day trips soon.  He is involved in home projects and updates, including transitioning computers to Windows 11 and completing handyman tasks around the house.  His blood sugar levels have been stable in the past. He mentions a recent spell of sleep disturbances but notes improvement in the last week. Physical Exam MEASUREMENTS: Weight- 253. Results Assessment & Plan Hyperlipidemia Managed with Zetia . Stiffness less severe than with simvastatin, symptoms tolerable. - Continue Zetia .  Obesity Weight decreased from 263 lbs to 253 lbs. - Encourage healthy eating and physical activity.  Gastroesophageal reflux disease (GERD) Reflux medication effectively managing symptoms.  Stress, not dysfunctional Experiencing stress related to personal circumstances, not dysfunctional. Managing stress despite feeling more annoyed.    The 10-year ASCVD risk score (Arnett DK, et al., 2019) is: 16.3%  Past Medical History:  Diagnosis Date   Allergy long ago   sulfa, shellfish   Anxiety long ago   panic attack 3ish times over decades   Arthritis    Asthma    childhood   GERD (gastroesophageal reflux disease)  Hyperlipemia    Pancreatitis    Past Surgical History:  Procedure Laterality Date   JOINT REPLACEMENT  03/13/23   right hip   TONSILLECTOMY     TOTAL HIP ARTHROPLASTY Right 03/13/2023   Procedure: TOTAL HIP ARTHROPLASTY ANTERIOR APPROACH;  Surgeon:  Fidel Rogue, MD;  Location: [facility];  Service: Orthopedics;  Laterality: Right;  130   Social History   Tobacco Use   Smoking status: Never   Smokeless tobacco: Never  Substance Use Topics   Alcohol  use: Not Currently   Drug use: Never   Family History  Problem Relation Age of Onset   Cancer Mother    Pneumonia Father        with aspiration    Hypertension Father    Aneurysm Father    Cancer Father    Bipolar disorder Son    Alcohol  abuse Son    Drug abuse Son    Early death Son    Allergies  Allergen Reactions   Sulfa Antibiotics Other (See Comments)    Swelling of eyes, with eye drainage   Shellfish Allergy Nausea And Vomiting   Sulfamethoxazole Other (See Comments)    Unsure had medicine as a child Unsure had medicine as a child    Other Nausea And Vomiting    States topical iodine  causes no problems. Other reaction(s): Nausea And Vomiting States topical iodine  causes no problems.     ROS    Objective:    BP (!) 138/90   Pulse 69   Temp 98.3 F (36.8 C)   Ht 5' 10 (1.778 m)   Wt 253 lb 4 oz (114.9 kg)   SpO2 99%   BMI 36.34 kg/m  BP Readings from Last 3 Encounters:  02/02/24 (!) 138/90  08/05/23 132/86  03/13/23 132/80   Wt Readings from Last 3 Encounters:  02/02/24 253 lb 4 oz (114.9 kg)  08/05/23 263 lb 3.2 oz (119.4 kg)  03/13/23 254 lb (115.2 kg)    Physical Exam Vitals and nursing note reviewed.  Constitutional:      General: He is not in acute distress.    Appearance: Normal appearance.  HENT:     Head: Normocephalic.     Right Ear: Tympanic membrane, ear canal and external ear normal.     Left Ear: Tympanic membrane, ear canal and external ear normal.  Eyes:     Extraocular Movements: Extraocular movements intact.     Pupils: Pupils are equal, round, and reactive to light.  Cardiovascular:     Rate and Rhythm: Normal rate and regular rhythm.     Heart sounds: Normal heart sounds.  Pulmonary:     Effort: Pulmonary effort is  normal.     Breath sounds: Normal breath sounds.  Musculoskeletal:     Right lower leg: No edema.     Left lower leg: No edema.  Neurological:     General: No focal deficit present.     Mental Status: He is alert and oriented to person, place, and time.  Psychiatric:        Mood and Affect: Mood normal.        Behavior: Behavior normal.        Thought Content: Thought content normal.        Judgment: Judgment normal.      No results found for any visits on 02/02/24.        Subjective:   William Lynch is a 68 y.o. male who presents for  Medicare Annual/Subsequent preventive examination.  Visit Complete: In person  Patient Medicare AWV questionnaire was completed by the patient on 01/29/2024 ; I have confirmed that all information answered by patient is correct and no changes since this date.  Cardiac Risk Factors include: male gender;dyslipidemia;advanced age (>28men, >77 women);obesity (BMI >30kg/m2)     Objective:    Today's Vitals   02/02/24 1136  BP: (!) 138/90  Pulse: 69  Temp: 98.3 F (36.8 C)  SpO2: 99%  Weight: 253 lb 4 oz (114.9 kg)  Height: 5' 10 (1.778 m)   Body mass index is 36.34 kg/m.     02/02/2024   11:39 AM 03/13/2023    5:54 AM 02/28/2023    2:09 PM  Advanced Directives  Does Patient Have a Medical Advance Directive? Yes Yes Yes  Type of Advance Directive Living will;Healthcare Power of [employer] Power of [employer] Power of Attorney  Does patient want to make changes to medical advance directive? No - Patient declined No - Patient declined   Copy of Healthcare Power of Attorney in Chart? No - copy requested No - copy requested No - copy requested    Current Medications (verified) Outpatient Encounter Medications as of 02/02/2024  Medication Sig   clobetasol cream (TEMOVATE) 0.05 % Apply 1 application  topically 2 (two) times daily as needed (skin irritation).   ezetimibe  (ZETIA ) 10 MG tablet TAKE 1 TABLET AT BEDTIME    levocetirizine (XYZAL) 5 MG tablet Take 5 mg by mouth in the morning and at bedtime.   meloxicam  (MOBIC ) 15 MG tablet Take 1 tablet (15 mg total) by mouth daily.   pantoprazole  (PROTONIX ) 40 MG tablet TAKE 1 TABLET IN THE MORNING   ondansetron  (ZOFRAN ) 4 MG tablet Take 1 tablet (4 mg total) by mouth every 8 (eight) hours as needed for nausea or vomiting. (Patient not taking: Reported on 02/02/2024)   polyethylene glycol powder (MIRALAX ) 17 GM/SCOOP powder Mix 17 g of powder in 4 oz of water  or juice and drink by mouth daily as needed for moderate or severe constipation. (Patient not taking: Reported on 02/02/2024)   tadalafil (CIALIS) 20 MG tablet Take 20 mg by mouth daily as needed for erectile dysfunction. (Patient not taking: Reported on 02/02/2024)   [DISCONTINUED] HYDROcodone -acetaminophen  (NORCO) 5-325 MG tablet Take 1 tablet by mouth every 4 (four) hours as needed for moderate pain. (Patient not taking: Reported on 08/05/2023)   No facility-administered encounter medications on file as of 02/02/2024.    Allergies (verified) Sulfa antibiotics, Shellfish allergy, Sulfamethoxazole, and Other   History: Past Medical History:  Diagnosis Date   Allergy long ago   sulfa, shellfish   Anxiety long ago   panic attack 3ish times over decades   Arthritis    Asthma    childhood   GERD (gastroesophageal reflux disease)    Hyperlipemia    Pancreatitis    Past Surgical History:  Procedure Laterality Date   JOINT REPLACEMENT  03/13/23   right hip   TONSILLECTOMY     TOTAL HIP ARTHROPLASTY Right 03/13/2023   Procedure: TOTAL HIP ARTHROPLASTY ANTERIOR APPROACH;  Surgeon: Fidel Rogue, MD;  Location: [facility];  Service: Orthopedics;  Laterality: Right;  130   Family History  Problem Relation Age of Onset   Cancer Mother    Pneumonia Father        with aspiration    Hypertension Father    Aneurysm Father    Cancer Father    Bipolar disorder Son  Alcohol  abuse Son    Drug abuse Son     Early death Son    Social History   Socioeconomic History   Marital status: Married    Spouse name: Not on file   Number of children: 2   Years of education: Not on file   Highest education level: Master's degree (e.g., MA, MS, MEng, MEd, MSW, MBA)  Occupational History   Occupation: IT  Tobacco Use   Smoking status: Never   Smokeless tobacco: Never  Substance and Sexual Activity   Alcohol  use: Not Currently   Drug use: Never   Sexual activity: Yes  Other Topics Concern   Not on file  Social History Narrative   Lives in [place]. Married for over 40 years. [employer] graduate and enjoys basketball. Has retired. Pt cooks at home.   Social Drivers of Corporate investment banker Strain: Low Risk  (01/29/2024)   Overall Financial Resource Strain (CARDIA)    Difficulty of Paying Living Expenses: Not hard at all  Food Insecurity: No Food Insecurity (01/29/2024)   Hunger Vital Sign    Worried About Running Out of Food in the Last Year: Never true    Ran Out of Food in the Last Year: Never true  Transportation Needs: No Transportation Needs (01/29/2024)   PRAPARE - Administrator, Civil Service (Medical): No    Lack of Transportation (Non-Medical): No  Physical Activity: Insufficiently Active (01/29/2024)   Exercise Vital Sign    Days of Exercise per Week: 3 days    Minutes of Exercise per Session: 30 min  Stress: Stress Concern Present (01/29/2024)   [employer] of Occupational Health - Occupational Stress Questionnaire    Feeling of Stress: To some extent  Social Connections: Socially Isolated (01/29/2024)   Social Connection and Isolation Panel    Frequency of Communication with Friends and Family: Never    Frequency of Social Gatherings with Friends and Family: Once a week    Attends Religious Services: Never    Database administrator or Organizations: No    Attends Engineer, structural: Not on file    Marital Status: Married    Tobacco Counseling Counseling  given: Not Answered   Clinical Intake:  Pre-visit preparation completed: Yes  Pain : No/denies pain     BMI - recorded: 36.34 Nutritional Status: BMI > 30  Obese Nutritional Risks: None Diabetes: No  How often do you need to have someone help you when you read instructions, pamphlets, or other written materials from your doctor or pharmacy?: 1 - Never What is the last grade level you completed in school?: Masters degree  Interpreter Needed?: No  Information entered by :: Elida GORMAN Manila, CMA   Activities of Daily Living    01/30/2024   12:01 AM 02/28/2023    2:14 PM  In your present state of health, do you have any difficulty performing the following activities:  Hearing? 0   Vision? 0   Difficulty concentrating or making decisions? 0   Walking or climbing stairs? 0   Dressing or bathing? 0   Doing errands, shopping? 0 0  Preparing Food and eating ? N   Using the Toilet? N   In the past six months, have you accidently leaked urine? N   Do you have problems with loss of bowel control? N   Managing your Medications? N   Managing your Finances? N   Housekeeping or managing your Housekeeping? N  Patient Care Team: Aletha Bene, MD as PCP - General (Family Medicine)  Indicate any recent Medical Services you may have received from other than [facility] providers in the past year (date may be approximate).     Assessment:   This is a routine wellness examination for William Lynch.  Hearing/Vision screen Hearing Screening - Comments:: Pt c/o of tinnitus.  Vision Screening - Comments:: Wears glasses- no concerns.    Goals Addressed             This Visit's Progress    Complete home repairs         Depression Screen    02/02/2024   11:40 AM 08/05/2023   11:33 AM  PHQ 2/9 Scores  PHQ - 2 Score 0 0  PHQ- 9 Score  2    Fall Risk    01/30/2024   12:01 AM 08/05/2023   11:33 AM  Fall Risk   Falls in the past year? 0 1  Number falls in past yr: 0 0  Injury  with Fall? 0 0  Risk for fall due to :  History of fall(s);Impaired balance/gait    MEDICARE RISK AT HOME: Medicare Risk at Home Any stairs in or around the home?: (Patient-Rptd) Yes If so, are there any without handrails?: (Patient-Rptd) No Home free of loose throw rugs in walkways, pet beds, electrical cords, etc?: (Patient-Rptd) Yes Adequate lighting in your home to reduce risk of falls?: (Patient-Rptd) Yes Life alert?: (Patient-Rptd) No Use of a cane, walker or w/c?: (Patient-Rptd) No Grab bars in the bathroom?: (Patient-Rptd) No Shower chair or bench in shower?: (Patient-Rptd) Yes Elevated toilet seat or a handicapped toilet?: (Patient-Rptd) Yes  TIMED UP AND GO:  Was the test performed?  Yes  Length of time to ambulate 10 feet: 6 sec Gait steady and fast without use of assistive device    Cognitive Function:        02/02/2024   11:40 AM  6CIT Screen  What Year? 0 points  What month? 0 points  What time? 0 points  Count back from 20 0 points  Months in reverse 0 points  Repeat phrase 0 points  Total Score 0 points    Immunizations Immunization History  Administered Date(s) Administered   Fluad Quad(high Dose 65+) 04/18/2022   Fluad Trivalent(High Dose 65+) 05/01/2023   Fluzone Influenza virus vaccine,trivalent (IIV3), split virus 07/09/2011   Influenza, High Dose Seasonal PF 04/25/2021   Influenza, Seasonal, Injecte, Preservative Fre 03/19/2012   Influenza,inj,Quad PF,6+ Mos 03/30/2013, 06/27/2015, 03/05/2016, 04/18/2017, 03/31/2018, 03/24/2019, 05/03/2020   Influenza,inj,quad, With Preservative 04/18/2014   Influenza-Unspecified 04/18/2014, 06/27/2015, 03/05/2016, 04/25/2021   PFIZER Comirnaty(Gray Top)Covid-19 Tri-Sucrose Vaccine 11/02/2020   PFIZER(Purple Top)SARS-COV-2 Vaccination 09/08/2019, 09/29/2019, 04/08/2020   PNEUMOCOCCAL CONJUGATE-20 08/14/2022   Pfizer Covid-19 Vaccine Bivalent Booster 31yrs & up 03/19/2021, 02/19/2022   Pfizer(Comirnaty)Fall  Seasonal Vaccine 12 years and older 07/10/2022, 04/17/2023   Pneumococcal Conjugate-13 05/28/2021   Pneumococcal Polysaccharide-23 08/14/2022   Respiratory Syncytial Virus Vaccine,Recomb Aduvanted(Arexvy) 02/05/2022, 07/31/2022, 07/31/2022   Tdap 02/05/2022   Zoster Recombinant(Shingrix) 11/08/2020, 01/08/2021    TDAP status: Up to date  Flu Vaccine status: Due, Education has been provided regarding the importance of this vaccine. Advised may receive this vaccine at local pharmacy or Health Dept. Aware to provide a copy of the vaccination record if obtained from local pharmacy or Health Dept. Verbalized acceptance and understanding.  Pneumococcal vaccine status: Up to date  Covid-19 vaccine status: Information provided on how to obtain vaccines.  Qualifies for Shingles Vaccine? Yes   Zostavax completed Yes   Shingrix Completed?: Yes  Screening Tests Health Maintenance  Topic Date Due   COVID-19 Vaccine (9 - Pfizer risk 2024-25 season) 10/16/2023   INFLUENZA VACCINE  01/23/2024   Medicare Annual Wellness (AWV)  02/01/2025   Colonoscopy  08/22/2025   DTaP/Tdap/Td (2 - Td or Tdap) 02/06/2032   Pneumococcal Vaccine: 50+ Years  Completed   Hepatitis C Screening  Completed   Zoster Vaccines- Shingrix  Completed   Hepatitis B Vaccines  Aged Out   HPV VACCINES  Aged Out   Meningococcal B Vaccine  Aged Out    Health Maintenance  Health Maintenance Due  Topic Date Due   COVID-19 Vaccine (9 - Pfizer risk 2024-25 season) 10/16/2023   INFLUENZA VACCINE  01/23/2024    Colorectal cancer screening: Type of screening: Colonoscopy. Completed 08/23/2015. Repeat every 10 years  Lung Cancer Screening: (Low Dose CT Chest recommended if Age 97-80 years, 20 pack-year currently smoking OR have quit w/in 15years.) does not qualify.   Lung Cancer Screening Referral: n/a  Additional Screening:  Hepatitis C Screening: does qualify; Completed 11/01/2020 Vision Screening: Recommended annual  ophthalmology exams for early detection of glaucoma and other disorders of the eye. Is the patient up to date with their annual eye exam?  No  Who is the provider or what is the name of the office in which the patient attends annual eye exams? Dr Roz- retired If pt is not established with a provider, would they like to be referred to a provider to establish care? Yes .   Dental Screening: Recommended annual dental exams for proper oral hygiene  Diabetic Foot Exam: n/a  Community Resource Referral / Chronic Care Management: CRR required this visit?  No   CCM required this visit?  No     Plan:     I have personally reviewed and noted the following in the patient's chart:   Medical and social history Use of alcohol , tobacco or illicit drugs  Current medications and supplements including opioid prescriptions. Patient is not currently taking opioid prescriptions. Functional ability and status Nutritional status Physical activity Advanced directives List of other physicians Hospitalizations, surgeries, and ER visits in previous 12 months Vitals Screenings to include cognitive, depression, and falls Referrals and appointments  In addition, I have reviewed and discussed with patient certain preventive protocols, quality metrics, and best practice recommendations. A written personalized care plan for preventive services as well as general preventive health recommendations were provided to patient.     Connie Emperor, MD   02/02/2024   After Visit Summary: (In Person-Printed) AVS printed and given to the patient  Nurse Notes: It was great seeing you today!

## 2024-02-03 ENCOUNTER — Ambulatory Visit: Payer: Self-pay | Admitting: Family Medicine

## 2024-02-03 LAB — CBC WITH DIFFERENTIAL/PLATELET
Absolute Lymphocytes: 2074 {cells}/uL (ref 850–3900)
Absolute Monocytes: 639 {cells}/uL (ref 200–950)
Basophils Absolute: 48 {cells}/uL (ref 0–200)
Basophils Relative: 0.7 %
Eosinophils Absolute: 279 {cells}/uL (ref 15–500)
Eosinophils Relative: 4.1 %
HCT: 46.1 % (ref 38.5–50.0)
Hemoglobin: 15.2 g/dL (ref 13.2–17.1)
MCH: 28.6 pg (ref 27.0–33.0)
MCHC: 33 g/dL (ref 32.0–36.0)
MCV: 86.8 fL (ref 80.0–100.0)
MPV: 10 fL (ref 7.5–12.5)
Monocytes Relative: 9.4 %
Neutro Abs: 3760 {cells}/uL (ref 1500–7800)
Neutrophils Relative %: 55.3 %
Platelets: 285 Thousand/uL (ref 140–400)
RBC: 5.31 Million/uL (ref 4.20–5.80)
RDW: 13 % (ref 11.0–15.0)
Total Lymphocyte: 30.5 %
WBC: 6.8 Thousand/uL (ref 3.8–10.8)

## 2024-02-03 LAB — LIPID PANEL
Cholesterol: 201 mg/dL — ABNORMAL HIGH (ref <200)
HDL: 68 mg/dL (ref >=40)
LDL Cholesterol (Calc): 116 mg/dL — ABNORMAL HIGH
Non-HDL Cholesterol (Calc): 133 mg/dL — ABNORMAL HIGH (ref <130)
Total CHOL/HDL Ratio: 3 (calc) (ref <5.0)
Triglycerides: 71 mg/dL (ref <150)

## 2024-02-03 LAB — COMPREHENSIVE METABOLIC PANEL WITH GFR
AG Ratio: 1.8 (calc) (ref 1.0–2.5)
ALT: 20 U/L (ref 9–46)
AST: 15 U/L (ref 10–35)
Albumin: 4.7 g/dL (ref 3.6–5.1)
Alkaline phosphatase (APISO): 98 U/L (ref 35–144)
BUN: 13 mg/dL (ref 7–25)
CO2: 28 mmol/L (ref 20–32)
Calcium: 9.7 mg/dL (ref 8.6–10.3)
Chloride: 104 mmol/L (ref 98–110)
Creat: 1.07 mg/dL (ref 0.70–1.35)
Globulin: 2.6 g/dL (ref 1.9–3.7)
Glucose, Bld: 108 mg/dL — ABNORMAL HIGH (ref 65–99)
Potassium: 4.3 mmol/L (ref 3.5–5.3)
Sodium: 140 mmol/L (ref 135–146)
Total Bilirubin: 0.6 mg/dL (ref 0.2–1.2)
Total Protein: 7.3 g/dL (ref 6.1–8.1)
eGFR: 76 mL/min/1.73m2 (ref >=60)

## 2024-02-06 ENCOUNTER — Encounter: Payer: Self-pay | Admitting: Family Medicine

## 2024-02-14 ENCOUNTER — Encounter: Payer: Self-pay | Admitting: Family Medicine

## 2024-02-17 ENCOUNTER — Other Ambulatory Visit: Payer: Self-pay

## 2024-02-17 MED ORDER — BREXPIPRAZOLE 1 MG PO TABS: 1.0000 mg | ORAL_TABLET | Freq: Every day | ORAL | 0 refills | Status: DC

## 2024-02-17 NOTE — Progress Notes (Signed)
 Erroneous encounter

## 2024-02-24 ENCOUNTER — Other Ambulatory Visit (HOSPITAL_COMMUNITY): Payer: Self-pay

## 2024-02-25 ENCOUNTER — Other Ambulatory Visit (HOSPITAL_COMMUNITY): Payer: Self-pay

## 2024-02-26 ENCOUNTER — Other Ambulatory Visit (HOSPITAL_COMMUNITY): Payer: Self-pay

## 2024-03-16 ENCOUNTER — Encounter: Payer: Self-pay | Admitting: Family Medicine

## 2024-07-28 ENCOUNTER — Encounter: Payer: Self-pay | Admitting: Family Medicine

## 2024-08-04 ENCOUNTER — Ambulatory Visit: Admitting: Family Medicine

## 2025-02-04 ENCOUNTER — Encounter
